# Patient Record
Sex: Female | Born: 1965 | Race: White | Hispanic: No | Marital: Married | Smoking: Never smoker
Health system: Southern US, Community
[De-identification: ages and names within clinical notes are randomized; demographics above are authoritative.]

## PROBLEM LIST (undated history)

## (undated) DIAGNOSIS — Z803 Family history of malignant neoplasm of breast: Secondary | ICD-10-CM

## (undated) DIAGNOSIS — N63 Unspecified lump in unspecified breast: Secondary | ICD-10-CM

## (undated) DIAGNOSIS — Z801 Family history of malignant neoplasm of trachea, bronchus and lung: Secondary | ICD-10-CM

## (undated) DIAGNOSIS — M1990 Unspecified osteoarthritis, unspecified site: Secondary | ICD-10-CM

## (undated) DIAGNOSIS — N6310 Unspecified lump in the right breast, unspecified quadrant: Secondary | ICD-10-CM

## (undated) DIAGNOSIS — Z808 Family history of malignant neoplasm of other organs or systems: Secondary | ICD-10-CM

## (undated) DIAGNOSIS — E039 Hypothyroidism, unspecified: Secondary | ICD-10-CM

## (undated) HISTORY — DX: Family history of malignant neoplasm of trachea, bronchus and lung: Z80.1

## (undated) HISTORY — PX: WISDOM TOOTH EXTRACTION: SHX21

## (undated) HISTORY — PX: CHOLECYSTECTOMY: SHX55

## (undated) HISTORY — DX: Family history of malignant neoplasm of other organs or systems: Z80.8

## (undated) HISTORY — DX: Family history of malignant neoplasm of breast: Z80.3

---
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
## 1999-03-14 ENCOUNTER — Encounter: Admission: RE | Admit: 1999-03-14 | Discharge: 1999-06-12 | Payer: Self-pay | Admitting: Obstetrics and Gynecology

## 1999-05-09 ENCOUNTER — Inpatient Hospital Stay (HOSPITAL_COMMUNITY): Admission: AD | Admit: 1999-05-09 | Discharge: 1999-05-09 | Payer: Self-pay | Admitting: *Deleted

## 1999-05-19 ENCOUNTER — Inpatient Hospital Stay (HOSPITAL_COMMUNITY): Admission: AD | Admit: 1999-05-19 | Discharge: 1999-05-22 | Payer: Self-pay | Admitting: Obstetrics and Gynecology

## 1999-12-17 ENCOUNTER — Other Ambulatory Visit: Admission: RE | Admit: 1999-12-17 | Discharge: 1999-12-17 | Payer: Self-pay | Admitting: Gynecology

## 2001-03-03 ENCOUNTER — Other Ambulatory Visit: Admission: RE | Admit: 2001-03-03 | Discharge: 2001-03-03 | Payer: Self-pay | Admitting: Gynecology

## 2001-12-30 ENCOUNTER — Inpatient Hospital Stay (HOSPITAL_COMMUNITY): Admission: AD | Admit: 2001-12-30 | Discharge: 2002-01-02 | Payer: Self-pay | Admitting: Obstetrics and Gynecology

## 2002-11-07 ENCOUNTER — Other Ambulatory Visit: Admission: RE | Admit: 2002-11-07 | Discharge: 2002-11-07 | Payer: Self-pay | Admitting: Obstetrics and Gynecology

## 2003-12-11 ENCOUNTER — Other Ambulatory Visit: Admission: RE | Admit: 2003-12-11 | Discharge: 2003-12-11 | Payer: Self-pay | Admitting: Obstetrics and Gynecology

## 2003-12-13 ENCOUNTER — Encounter: Admission: RE | Admit: 2003-12-13 | Discharge: 2003-12-13 | Payer: Self-pay | Admitting: Obstetrics and Gynecology

## 2004-12-29 ENCOUNTER — Other Ambulatory Visit: Admission: RE | Admit: 2004-12-29 | Discharge: 2004-12-29 | Payer: Self-pay | Admitting: Obstetrics and Gynecology

## 2009-05-16 ENCOUNTER — Encounter: Admission: RE | Admit: 2009-05-16 | Discharge: 2009-05-16 | Payer: Self-pay | Admitting: Obstetrics and Gynecology

## 2011-01-02 NOTE — Op Note (Signed)
[facility]  Patient:    Deanna Patel, Deanna Patel Visit Number: [id number] MRN: [id number]          Service Type: OBS Location: 910A 9102 01 Attending Physician:  Osborn Coho Dictated by:   Janeece Riggers Dareen Piano, M.D. Proc. Date: 12/30/01 Admit Date:  12/30/2001 Discharge Date: 01/02/2002                             Operative Report  PREOPERATIVE DIAGNOSES:       1. Intrauterine pregnancy at term.                               2. History of prior cesarean section.                               3. Patient refuses attempt at vaginal birth                                  after cesarean section.                               4. Hypothyroidism on Synthroid.  POSTOPERATIVE DIAGNOSES:      1. Intrauterine pregnancy at term.                               2. History of prior cesarean section.                               3. Patient refuses attempt at vaginal birth                                  after cesarean section.                               4. Hypothyroidism on Synthroid.  PROCEDURE:                    Repeat low transverse cesarean section.  SURGEON:                      Mark E. Dareen Piano, M.D.  ASSIST:                       Brook A. Edward Jolly, M.D.  ANTIBIOTICS:                  Ancef 1 g.  ANESTHESIA:                   Epidural.  DRAINS:                       Foley to bed side drainage.  ESTIMATED BLOOD LOSS:         900 cc.  SPECIMENS:                    None.  FINDINGS:  Normal appearing fallopian tubes, ovaries, and uterus.  Normal placenta.  Patient delivered one live viable white female infant.  COMPLICATIONS:                None.  PROCEDURE:                    Patient was taken to the operating room where an epidural anesthetic was placed without complications.  She was then placed in the dorsal supine position, prepped with Hibiclens, and draped in the usual fashion for this procedure.  Foley catheter was  placed.  A Pfannenstiel incision was made through the previous scar.  This was carried down to the fascia.  Fascia was entered in the midline, extended laterally.  Rectus muscles were then separated from the fascia with the Bovie.  Rectus muscles were divided in the midline with the knife and taken superiorly and inferiorly.  The bladder flap was taken down sharply.  A low transverse uterine incision was made in the midline and extended laterally with blunt dissection.  The amniotic fluid was noted to be clear.  The infant was delivered in the vertex presentation.  On delivery of the head the oropharynx and nostrils are bulb suctioned.  The remaining infant was then delivered. The cord was doubly clamped and cut and the infant handed to the waiting NICU team.  Cord blood was then obtained.  Placenta was then manually removed.  The uterine cavity was wiped with a wet lap and the uterine incision was closed in a single layer of 0 chromic in a running locking fashion.  The bladder flap was not closed.  The uterus was placed back in the abdominal cavity.  The abdominal gutters were wiped with a wet lap.  Hemostasis was checked and felt to be adequate.  The parietoperitoneum and rectus muscles reapproximated in the midline using 3-0 chromic in a running fashion.  The fascia was closed using 0 Monocryl suture in a running fashion.  Subcuticular tissue was made hemostatic with the Bovie.  Stainless steel clips were used to close the skin. Patient tolerated the procedure well.  She was taken to the recovery room in stable condition.  Instrument and lap counts were correct x2. Dictated by:   Janeece Riggers Dareen Piano, M.D. Attending Physician:  Osborn Coho DD:  12/30/01 TD:  01/02/02 Job: [id number]

## 2011-01-02 NOTE — Discharge Summary (Signed)
[facility]  Patient:    Deanna Patel, Deanna Patel Visit Number: [id number] MRN: [id number]          Service Type: OBS Location: 910A 9102 01 Attending Physician:  Osborn Coho Dictated by:   Gerrit Friends. Aldona Bar, M.D. Admit Date:  12/30/2001 Discharge Date: 01/02/2002                             Discharge Summary  DISCHARGE DIAGNOSES:          Term pregnancy, delivered 8 pound 2 ounce female infant with Apgars 8 and 9.  Blood type A+.  PROCEDURES:                   Repeat low transverse cesarean section.  SUMMARY:                      This 45 year old gravida 3, para 1 was admitted at term for repeat cesarean section.  Her hypothyroidism was well controlled during her pregnancy.  She was taken to the operating room on the day of admission for a low transverse cesarean section and was delivered of a viable female infant weighing 8 pounds 2 ounces with Apgars of 8 and 9.  Her postoperative/postpartum course was benign.  Her discharge hemoglobin was 11.3 with a white count of 14,100 and platelet count of 198,000.  She remained afebrile during her hospital course.  On the morning of May 19 she was ambulating well, tolerating a regular diet well, having normal bowel and bladder function, was afebrile.  Her breast-feeding was going well and she was deemed ready for discharge.  Accordingly, she was given all appropriate instructions at the time of discharge and understood all instructions well.  DISCHARGE MEDICATIONS:        1. Vitamins one q.d.                               2. Motrin 600 mg q.6h. p.r.n.                               3. Tylox one to two q.4-6h. p.r.n. for severe                                  pain.  FOLLOWUP:                     She will return to the office for follow-up in approximately four weeks time or as needed. Dictated by:   Gerrit Friends. Aldona Bar, M.D. Attending Physician:  Osborn Coho DD:  01/02/02 TD:  01/04/02 Job:  [id number]

## 2014-03-13 ENCOUNTER — Other Ambulatory Visit: Payer: Self-pay | Admitting: Family Medicine

## 2014-03-13 ENCOUNTER — Other Ambulatory Visit (HOSPITAL_COMMUNITY)
Admission: RE | Admit: 2014-03-13 | Discharge: 2014-03-13 | Disposition: A | Discharge status: Home / Self Care | Payer: BC Managed Care – PPO | Source: Ambulatory Visit | Attending: Family Medicine | Admitting: Family Medicine

## 2014-03-13 DIAGNOSIS — Z124 Encounter for screening for malignant neoplasm of cervix: Secondary | ICD-10-CM | POA: Insufficient documentation

## 2014-03-14 LAB — CYTOLOGY - PAP

## 2014-04-30 ENCOUNTER — Other Ambulatory Visit: Payer: Self-pay

## 2014-04-30 DIAGNOSIS — Z1231 Encounter for screening mammogram for malignant neoplasm of breast: Secondary | ICD-10-CM

## 2014-05-14 ENCOUNTER — Ambulatory Visit
Admission: RE | Admit: 2014-05-14 | Discharge: 2014-05-14 | Disposition: A | Discharge status: Home / Self Care | Payer: BC Managed Care – PPO | Source: Ambulatory Visit

## 2014-05-14 DIAGNOSIS — Z1231 Encounter for screening mammogram for malignant neoplasm of breast: Secondary | ICD-10-CM

## 2015-06-14 ENCOUNTER — Other Ambulatory Visit (HOSPITAL_COMMUNITY)
Admission: RE | Admit: 2015-06-14 | Discharge: 2015-06-14 | Disposition: A | Discharge status: Home / Self Care | Payer: BLUE CROSS/BLUE SHIELD | Source: Ambulatory Visit | Attending: Family Medicine | Admitting: Family Medicine

## 2015-06-14 ENCOUNTER — Other Ambulatory Visit: Payer: Self-pay | Admitting: Family Medicine

## 2015-06-14 DIAGNOSIS — Z124 Encounter for screening for malignant neoplasm of cervix: Secondary | ICD-10-CM | POA: Diagnosis present

## 2015-06-20 LAB — CYTOLOGY - PAP

## 2015-11-19 DIAGNOSIS — E038 Other specified hypothyroidism: Secondary | ICD-10-CM | POA: Diagnosis not present

## 2015-11-21 DIAGNOSIS — E063 Autoimmune thyroiditis: Secondary | ICD-10-CM | POA: Diagnosis not present

## 2015-11-21 DIAGNOSIS — E038 Other specified hypothyroidism: Secondary | ICD-10-CM | POA: Diagnosis not present

## 2016-06-11 DIAGNOSIS — L918 Other hypertrophic disorders of the skin: Secondary | ICD-10-CM | POA: Diagnosis not present

## 2016-06-11 DIAGNOSIS — D1801 Hemangioma of skin and subcutaneous tissue: Secondary | ICD-10-CM | POA: Diagnosis not present

## 2016-06-11 DIAGNOSIS — D2262 Melanocytic nevi of left upper limb, including shoulder: Secondary | ICD-10-CM | POA: Diagnosis not present

## 2016-06-11 DIAGNOSIS — D225 Melanocytic nevi of trunk: Secondary | ICD-10-CM | POA: Diagnosis not present

## 2016-06-11 DIAGNOSIS — D2272 Melanocytic nevi of left lower limb, including hip: Secondary | ICD-10-CM | POA: Diagnosis not present

## 2016-06-25 DIAGNOSIS — E559 Vitamin D deficiency, unspecified: Secondary | ICD-10-CM | POA: Diagnosis not present

## 2016-06-25 DIAGNOSIS — Z23 Encounter for immunization: Secondary | ICD-10-CM | POA: Diagnosis not present

## 2016-06-25 DIAGNOSIS — Z0000 Encounter for general adult medical examination without abnormal findings: Secondary | ICD-10-CM | POA: Diagnosis not present

## 2016-07-16 ENCOUNTER — Other Ambulatory Visit: Payer: Self-pay | Admitting: Family Medicine

## 2016-07-16 DIAGNOSIS — Z1231 Encounter for screening mammogram for malignant neoplasm of breast: Secondary | ICD-10-CM

## 2016-08-21 ENCOUNTER — Ambulatory Visit
Admission: RE | Admit: 2016-08-21 | Discharge: 2016-08-21 | Disposition: A | Discharge status: Home / Self Care | Payer: BLUE CROSS/BLUE SHIELD | Source: Ambulatory Visit | Attending: Family Medicine | Admitting: Family Medicine

## 2016-08-21 DIAGNOSIS — Z1231 Encounter for screening mammogram for malignant neoplasm of breast: Secondary | ICD-10-CM | POA: Diagnosis not present

## 2016-11-18 DIAGNOSIS — E063 Autoimmune thyroiditis: Secondary | ICD-10-CM | POA: Diagnosis not present

## 2016-11-18 DIAGNOSIS — E039 Hypothyroidism, unspecified: Secondary | ICD-10-CM | POA: Diagnosis not present

## 2016-11-19 DIAGNOSIS — E669 Obesity, unspecified: Secondary | ICD-10-CM | POA: Diagnosis not present

## 2016-11-19 DIAGNOSIS — Z6833 Body mass index (BMI) 33.0-33.9, adult: Secondary | ICD-10-CM | POA: Diagnosis not present

## 2016-11-19 DIAGNOSIS — E063 Autoimmune thyroiditis: Secondary | ICD-10-CM | POA: Diagnosis not present

## 2016-11-19 DIAGNOSIS — E038 Other specified hypothyroidism: Secondary | ICD-10-CM | POA: Diagnosis not present

## 2017-05-07 DIAGNOSIS — N63 Unspecified lump in unspecified breast: Secondary | ICD-10-CM

## 2017-05-07 HISTORY — DX: Unspecified lump in unspecified breast: N63.0

## 2017-06-29 ENCOUNTER — Other Ambulatory Visit: Payer: Self-pay | Admitting: Family Medicine

## 2017-06-29 DIAGNOSIS — Z0000 Encounter for general adult medical examination without abnormal findings: Secondary | ICD-10-CM | POA: Diagnosis not present

## 2017-06-29 DIAGNOSIS — Z23 Encounter for immunization: Secondary | ICD-10-CM | POA: Diagnosis not present

## 2017-06-29 DIAGNOSIS — N63 Unspecified lump in unspecified breast: Secondary | ICD-10-CM

## 2017-06-29 DIAGNOSIS — E559 Vitamin D deficiency, unspecified: Secondary | ICD-10-CM | POA: Diagnosis not present

## 2017-06-29 DIAGNOSIS — Z8632 Personal history of gestational diabetes: Secondary | ICD-10-CM | POA: Diagnosis not present

## 2017-06-29 DIAGNOSIS — E063 Autoimmune thyroiditis: Secondary | ICD-10-CM | POA: Diagnosis not present

## 2017-07-06 ENCOUNTER — Ambulatory Visit
Admission: RE | Admit: 2017-07-06 | Discharge: 2017-07-06 | Disposition: A | Discharge status: Home / Self Care | Payer: BLUE CROSS/BLUE SHIELD | Source: Ambulatory Visit | Attending: Family Medicine | Admitting: Family Medicine

## 2017-07-06 DIAGNOSIS — N6489 Other specified disorders of breast: Secondary | ICD-10-CM | POA: Diagnosis not present

## 2017-07-06 DIAGNOSIS — N63 Unspecified lump in unspecified breast: Secondary | ICD-10-CM

## 2017-07-06 DIAGNOSIS — R928 Other abnormal and inconclusive findings on diagnostic imaging of breast: Secondary | ICD-10-CM | POA: Diagnosis not present

## 2017-07-06 HISTORY — DX: Unspecified lump in unspecified breast: N63.0

## 2017-08-06 DIAGNOSIS — L918 Other hypertrophic disorders of the skin: Secondary | ICD-10-CM | POA: Diagnosis not present

## 2017-08-06 DIAGNOSIS — L438 Other lichen planus: Secondary | ICD-10-CM | POA: Diagnosis not present

## 2017-08-06 DIAGNOSIS — D2271 Melanocytic nevi of right lower limb, including hip: Secondary | ICD-10-CM | POA: Diagnosis not present

## 2017-08-06 DIAGNOSIS — L821 Other seborrheic keratosis: Secondary | ICD-10-CM | POA: Diagnosis not present

## 2017-08-06 DIAGNOSIS — L570 Actinic keratosis: Secondary | ICD-10-CM | POA: Diagnosis not present

## 2017-08-06 DIAGNOSIS — D224 Melanocytic nevi of scalp and neck: Secondary | ICD-10-CM | POA: Diagnosis not present

## 2017-11-15 DIAGNOSIS — E038 Other specified hypothyroidism: Secondary | ICD-10-CM | POA: Diagnosis not present

## 2017-11-15 DIAGNOSIS — E063 Autoimmune thyroiditis: Secondary | ICD-10-CM | POA: Diagnosis not present

## 2017-11-19 DIAGNOSIS — E038 Other specified hypothyroidism: Secondary | ICD-10-CM | POA: Diagnosis not present

## 2017-11-19 DIAGNOSIS — Z6832 Body mass index (BMI) 32.0-32.9, adult: Secondary | ICD-10-CM | POA: Diagnosis not present

## 2017-11-19 DIAGNOSIS — E063 Autoimmune thyroiditis: Secondary | ICD-10-CM | POA: Diagnosis not present

## 2017-11-19 DIAGNOSIS — E669 Obesity, unspecified: Secondary | ICD-10-CM | POA: Diagnosis not present

## 2018-03-03 DIAGNOSIS — S70361A Insect bite (nonvenomous), right thigh, initial encounter: Secondary | ICD-10-CM | POA: Diagnosis not present

## 2018-03-03 DIAGNOSIS — S70361S Insect bite (nonvenomous), right thigh, sequela: Secondary | ICD-10-CM | POA: Diagnosis not present

## 2018-09-12 ENCOUNTER — Other Ambulatory Visit: Payer: Self-pay | Admitting: Family Medicine

## 2018-09-12 DIAGNOSIS — Z1231 Encounter for screening mammogram for malignant neoplasm of breast: Secondary | ICD-10-CM

## 2018-09-28 DIAGNOSIS — D485 Neoplasm of uncertain behavior of skin: Secondary | ICD-10-CM | POA: Diagnosis not present

## 2018-09-28 DIAGNOSIS — D2262 Melanocytic nevi of left upper limb, including shoulder: Secondary | ICD-10-CM | POA: Diagnosis not present

## 2018-09-28 DIAGNOSIS — D2261 Melanocytic nevi of right upper limb, including shoulder: Secondary | ICD-10-CM | POA: Diagnosis not present

## 2018-09-28 DIAGNOSIS — D2371 Other benign neoplasm of skin of right lower limb, including hip: Secondary | ICD-10-CM | POA: Diagnosis not present

## 2018-09-28 DIAGNOSIS — L820 Inflamed seborrheic keratosis: Secondary | ICD-10-CM | POA: Diagnosis not present

## 2018-09-28 DIAGNOSIS — D225 Melanocytic nevi of trunk: Secondary | ICD-10-CM | POA: Diagnosis not present

## 2018-10-06 ENCOUNTER — Encounter: Payer: Self-pay | Admitting: Radiology

## 2018-10-06 ENCOUNTER — Ambulatory Visit
Admission: RE | Admit: 2018-10-06 | Discharge: 2018-10-06 | Disposition: A | Discharge status: Home / Self Care | Payer: BLUE CROSS/BLUE SHIELD | Source: Ambulatory Visit | Attending: Family Medicine | Admitting: Family Medicine

## 2018-10-06 DIAGNOSIS — Z1231 Encounter for screening mammogram for malignant neoplasm of breast: Secondary | ICD-10-CM

## 2018-10-10 ENCOUNTER — Ambulatory Visit
Admission: RE | Admit: 2018-10-10 | Discharge: 2018-10-10 | Disposition: A | Discharge status: Home / Self Care | Payer: BLUE CROSS/BLUE SHIELD | Source: Ambulatory Visit | Attending: Family Medicine | Admitting: Family Medicine

## 2018-10-10 ENCOUNTER — Other Ambulatory Visit: Payer: Self-pay | Admitting: Family Medicine

## 2018-10-10 DIAGNOSIS — R928 Other abnormal and inconclusive findings on diagnostic imaging of breast: Secondary | ICD-10-CM

## 2018-10-10 DIAGNOSIS — N6313 Unspecified lump in the right breast, lower outer quadrant: Secondary | ICD-10-CM | POA: Diagnosis not present

## 2018-10-11 ENCOUNTER — Other Ambulatory Visit: Payer: Self-pay | Admitting: Family Medicine

## 2018-10-11 DIAGNOSIS — N63 Unspecified lump in unspecified breast: Secondary | ICD-10-CM

## 2018-10-21 DIAGNOSIS — R928 Other abnormal and inconclusive findings on diagnostic imaging of breast: Secondary | ICD-10-CM | POA: Diagnosis not present

## 2018-11-03 ENCOUNTER — Other Ambulatory Visit: Payer: Self-pay | Admitting: General Surgery

## 2018-11-03 DIAGNOSIS — Z803 Family history of malignant neoplasm of breast: Secondary | ICD-10-CM | POA: Diagnosis not present

## 2018-11-03 DIAGNOSIS — N6310 Unspecified lump in the right breast, unspecified quadrant: Secondary | ICD-10-CM

## 2018-11-03 DIAGNOSIS — E039 Hypothyroidism, unspecified: Secondary | ICD-10-CM | POA: Diagnosis not present

## 2018-11-11 DIAGNOSIS — E038 Other specified hypothyroidism: Secondary | ICD-10-CM | POA: Diagnosis not present

## 2018-11-11 DIAGNOSIS — E063 Autoimmune thyroiditis: Secondary | ICD-10-CM | POA: Diagnosis not present

## 2018-12-08 ENCOUNTER — Other Ambulatory Visit: Payer: Self-pay | Admitting: General Surgery

## 2018-12-08 DIAGNOSIS — N6310 Unspecified lump in the right breast, unspecified quadrant: Secondary | ICD-10-CM

## 2018-12-22 ENCOUNTER — Other Ambulatory Visit: Payer: Self-pay | Admitting: General Surgery

## 2019-01-05 DIAGNOSIS — E063 Autoimmune thyroiditis: Secondary | ICD-10-CM | POA: Diagnosis not present

## 2019-01-05 DIAGNOSIS — E669 Obesity, unspecified: Secondary | ICD-10-CM | POA: Diagnosis not present

## 2019-01-05 DIAGNOSIS — E038 Other specified hypothyroidism: Secondary | ICD-10-CM | POA: Diagnosis not present

## 2019-01-06 NOTE — Pre-Procedure Instructions (Signed)
SARIAH GUE  01/06/2019      [facility] - [place], [place] - [address] [place] [address] Phone: [phone] Fax: [phone]    Your procedure is scheduled on TUESDAY, JUNE 2..   Report to [facility], Main Entrance or Entrance "A" at 8:00 AM                    Your surgery or procedure is scheduled for 10:00 AM    Call this number if you have problems the morning of surgery: [phone]  This is the number for the Pre- Surgical Desk.                 For any other questions, please call [phone], Monday - Friday 8 AM - 4 PM.    Remember:  Do not eat  after midnight.  You may drink clear liquids until 7:00 AM.  Clear liquids allowed are: Water, Juice (non-citric and without pulp), Carbonated beverages, Clear Tea, Black Coffee only, Plain Jell-O only and Gatorade    Take these medicines the morning of surgery with A SIP OF WATER :  Synthyroid  1 Week prior to surgery STOP taking Aspirin, Aspirin Products (Goody Powder, Excedrin Migraine), Ibuprofen (Advil), Naproxen (Aleve), Vitamins and Herbal Products (ie Fish Oil).                        Special instructions:   [facility]- Preparing For Surgery  Before surgery, you can play an important role. Because skin is not sterile, your skin needs to be as free of germs as possible. You can reduce the number of germs on your skin by washing with CHG (chlorahexidine gluconate) Soap before surgery.  CHG is an antiseptic cleaner which kills germs and bonds with the skin to continue killing germs even after washing.    Oral Hygiene is also important to reduce your risk of infection.  Remember - BRUSH YOUR TEETH THE MORNING OF SURGERY WITH YOUR REGULAR TOOTHPASTE  Please do not use if you have an allergy to CHG or antibacterial soaps. If your skin becomes reddened/irritated stop using the CHG.  Do not shave (including legs and underarms) for at least 48 hours prior to first CHG  shower. It is OK to shave your face.  Please follow these instructions carefully.   1. Shower the NIGHT BEFORE SURGERY and the MORNING OF SURGERY with CHG.   2. If you chose to wash your hair, wash your hair first as usual with your normal shampoo.  3. After you shampoo, wash your face and private area with the soap you use at home, then rinse your hair and body thoroughly to remove the shampoo and soap.  4. Use CHG as you would any other liquid soap. You can apply CHG directly to the skin and wash gently with a scrungie or a clean washcloth.   5. Apply the CHG Soap to your body ONLY FROM THE NECK DOWN.  Do not use on open wounds or open sores. Avoid contact with your eyes, ears, mouth and genitals (private parts).   6. Wash thoroughly, paying special attention to the area where your surgery will be performed.  7. Thoroughly rinse your body with warm water from the neck down.  8. DO NOT shower/wash with your normal soap after using and rinsing off the CHG Soap.  9. Pat yourself dry with a CLEAN  TOWEL.  10. Wear CLEAN PAJAMAS to bed the night before surgery, wear comfortable clothes the morning of surgery  11. Place CLEAN SHEETS on your bed the night of your first shower and DO NOT SLEEP WITH PETS.  Day of Surgery: Shower as Instructed Above              Do not wear lotions, powders, or perfumes, or deodorant. Please wear clean clothes to the hospital/surgery center.   Remember to brush your teeth WITH YOUR REGULAR TOOTHPASTE.             Do not wear jewelry, make-up or nail polish.  Do not shave 48 hours prior to surgery.    Do not bring valuables to the hospital.  [facility] any belongings or valuables.  Contacts, dentures or bridgework may not be worn into surgery.  Leave your suitcase in the car.  After surgery it may be brought to your room.  For patients admitted to the hospital, discharge time will be determined by your treatment team.  Patients  discharged the day of surgery will not be allowed to drive home.   Please read over the following fact sheets that you were given.

## 2019-01-10 ENCOUNTER — Encounter (HOSPITAL_COMMUNITY): Payer: Self-pay

## 2019-01-10 ENCOUNTER — Encounter (HOSPITAL_COMMUNITY)
Admission: RE | Admit: 2019-01-10 | Discharge: 2019-01-10 | Disposition: A | Discharge status: Home / Self Care | Payer: BLUE CROSS/BLUE SHIELD | Source: Ambulatory Visit | Attending: General Surgery | Admitting: General Surgery

## 2019-01-10 ENCOUNTER — Other Ambulatory Visit: Payer: Self-pay

## 2019-01-10 DIAGNOSIS — Z01812 Encounter for preprocedural laboratory examination: Secondary | ICD-10-CM | POA: Diagnosis not present

## 2019-01-10 HISTORY — DX: Unspecified osteoarthritis, unspecified site: M19.90

## 2019-01-10 HISTORY — DX: Hypothyroidism, unspecified: E03.9

## 2019-01-10 LAB — CBC
HCT: 42.4 % (ref 36.0–46.0)
Hemoglobin: 13.8 g/dL (ref 12.0–15.0)
MCH: 29.6 pg (ref 26.0–34.0)
MCHC: 32.5 g/dL (ref 30.0–36.0)
MCV: 91 fL (ref 80.0–100.0)
Platelets: 281 10*3/uL (ref 150–400)
RBC: 4.66 MIL/uL (ref 3.87–5.11)
RDW: 12.8 % (ref 11.5–15.5)
WBC: 6.6 10*3/uL (ref 4.0–10.5)
nRBC: 0 % (ref 0.0–0.2)

## 2019-01-10 LAB — BASIC METABOLIC PANEL
Anion gap: 8 (ref 5–15)
BUN: 12 mg/dL (ref 6–20)
CO2: 22 mmol/L (ref 22–32)
Calcium: 9.4 mg/dL (ref 8.9–10.3)
Chloride: 107 mmol/L (ref 98–111)
Creatinine, Ser: 0.76 mg/dL (ref 0.44–1.00)
GFR calc Af Amer: >60 mL/min (ref >60)
GFR calc non Af Amer: >60 mL/min (ref >60)
Glucose, Bld: 105 mg/dL — ABNORMAL HIGH (ref 70–99)
Potassium: 4.1 mmol/L (ref 3.5–5.1)
Sodium: 137 mmol/L (ref 135–145)

## 2019-01-10 NOTE — Progress Notes (Addendum)
PCP - [name], MD Cardiologist - pt denies  Chest x-ray - pt denies EKG - pt denies  Stress Test - pt denies ever ECHO - pt denies ever  Cardiac Cath - pt denies ever  Sleep Study - NO CPAP - n/a  Fasting Blood Sugar - n/a Checks Blood Sugar _____ times a day -n/a  Blood Thinner Instructions: n/a Aspirin Instructions: n/a-   Anesthesia review: NO  Patient denies shortness of breath, fever, cough and chest pain at PAT appointment  Patient verbalized understanding of instructions that were given to them at the PAT appointment. Patient was also instructed that they will need to review over the PAT instructions again at home before surgery.   Coronavirus Screening  Have you experienced the following symptoms:  Cough yes/no: No Fever (>100.66F)  yes/no: No Runny nose yes/no: No Sore throat yes/no: No Difficulty breathing/shortness of breath  yes/no: No  Have you or a family member traveled in the last 14 days and where? yes/no: No   If the patient indicates "YES" to the above questions, their PAT will be rescheduled to limit the exposure to others and, the surgeon will be notified. THE PATIENT WILL NEED TO BE ASYMPTOMATIC FOR 14 DAYS.   If the patient is not experiencing any of these symptoms, the PAT nurse will instruct them to NOT bring anyone with them to their appointment since they may have these symptoms or traveled as well.   Please remind your patients and families that hospital visitation restrictions are in effect and the importance of the restrictions.

## 2019-01-13 ENCOUNTER — Other Ambulatory Visit (HOSPITAL_COMMUNITY)
Admission: RE | Admit: 2019-01-13 | Discharge: 2019-01-13 | Disposition: A | Discharge status: Home / Self Care | Payer: BC Managed Care – PPO | Source: Ambulatory Visit | Attending: General Surgery | Admitting: General Surgery

## 2019-01-13 DIAGNOSIS — Z1159 Encounter for screening for other viral diseases: Secondary | ICD-10-CM | POA: Diagnosis not present

## 2019-01-14 LAB — NOVEL CORONAVIRUS, NAA (HOSP ORDER, SEND-OUT TO REF LAB; TAT 18-24 HRS): SARS-CoV-2, NAA: NOT DETECTED

## 2019-01-15 NOTE — H&P (Addendum)
Deanna Patel  [facility] Patient #: [id number] DOB: 03-Sep-1965 Married / Language: English / Race: White Female        History of Present Illness      This is a very pleasant 53 year old female accompanied by one of her female family members. She is referred by Dr. Juluis RainierElizabeth Patel because of a small mass seen on recent imaging studies. She has a strong family history for breast cancer and is concerned about a 6 month follow-up.     No prior history of breast surgery but has had cysts aspirated by Dr. [name] in the past. Recent imaging studies showed including mammogram and ultrasound show a probable benign small mass in the right breast at the 7 o'clock position. Inferior and slightly lateral. Rounded density with a benign-looking calcification at the periphery. They've read this as a possible collection of cysts versus fibrocystic change. Six-month follow-up was encouraged. The patient is concerned because of her family history and after lengthy discussion wants this area excised.      Past history reveals hypothyroidism, BMI 33, breast cyst aspiration Family history reveals a maternal aunt died of breast cancer. Paternal first cousin had breast cancer and is a survivor. Maternal first cousin had breast cancer, is a survivor, and apparently has some type of genetic mutation The patient has never had genetic testing Social history reveals she is married with 2 children and lives in [place]. 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 Denies alcohol or tobacco      We discussed risk for a while. I told her that referral for genetic counseling was appropriate when she was interested in that. I discussed the options for management of the right breast mass which would include six-month follow-up or excision. She only wants to consider excision and that is appropriate She'll be scheduled for right breast lumpectomy with radioactive  seed localization. I discussed the indications, details, techniques, and risks with her in detail. She is aware of the risk of bleeding, infection, cosmetic deformity, chronic pain, reoperation of cancer. She understands all these issues. All of her questions are answered. She agrees with this plan.   Past Surgical History  Gallbladder Surgery - Open   Diagnostic Studies History  Mammogram  within last year  Allergies  No Known Allergies  No Known Drug Allergies  Allergies Reconciled   Medication History  Synthroid (137MCG Tablet, Oral) Active. Vitamin D (Cholecalciferol) (10 MCG(400 UNIT) Tablet Chewable, Oral) Active. Medications Reconciled  Social History  Alcohol use  Occasional alcohol use. Caffeine use  Coffee, Tea. No drug use  Tobacco use  Former smoker.  Family History Arthritis  Mother. Breast Cancer  Family Members In General. Colon Polyps  Father. Diabetes Mellitus  Mother. Hypertension  Mother.  Pregnancy / Birth History  Age at menarche  13 years. Age of menopause  11646-52 Contraceptive History  Oral contraceptives. Gravida  3 Irregular periods  Length (months) of breastfeeding  3-6 Maternal age  51-35 Para  2  Other Problems  Lump In Breast  Thyroid Disease     Review of Systems  General Not Present- Appetite Loss, Chills, Fatigue, Fever, Night Sweats, Weight Gain and Weight Loss. Skin Not Present- Change in Wart/Mole, Dryness, Hives, Jaundice, New Lesions, Non-Healing Wounds, Rash and Ulcer. HEENT Present- Seasonal Allergies. Not Present- Earache, Hearing Loss, Hoarseness, Nose Bleed, Oral Ulcers, Ringing in the Ears, Sinus Pain, Sore Throat, Visual Disturbances, Wears glasses/contact lenses and Yellow Eyes. Respiratory  Not Present- Bloody sputum, Chronic Cough, Difficulty Breathing, Snoring and Wheezing. Breast Present- Breast Mass. Not Present- Breast Pain, Nipple Discharge and Skin Changes. Cardiovascular Not  Present- Chest Pain, Difficulty Breathing Lying Down, Leg Cramps, Palpitations, Rapid Heart Rate, Shortness of Breath and Swelling of Extremities. Gastrointestinal Not Present- Abdominal Pain, Bloating, Bloody Stool, Change in Bowel Habits, Chronic diarrhea, Constipation, Difficulty Swallowing, Excessive gas, Gets full quickly at meals, Hemorrhoids, Indigestion, Nausea, Rectal Pain and Vomiting. Female Genitourinary Not Present- Frequency, Nocturia, Painful Urination, Pelvic Pain and Urgency. Neurological Not Present- Decreased Memory, Fainting, Headaches, Numbness, Seizures, Tingling, Tremor, Trouble walking and Weakness. Psychiatric Not Present- Anxiety, Bipolar, Change in Sleep Pattern, Depression, Fearful and Frequent crying. Endocrine Present- Hot flashes. Not Present- Cold Intolerance, Excessive Hunger, Hair Changes, Heat Intolerance and New Diabetes. Hematology Not Present- Blood Thinners, Easy Bruising, Excessive bleeding, Gland problems, HIV and Persistent Infections.  Vitals  Weight: 215.8 lb Height: 67in Body Surface Area: 2.09 m Body Mass Index: 33.8 kg/m  Temp.: 98.66F(Oral)  Pulse: 98 (Regular)  P.OX: 99% (Room air) BP: 148/88 (Sitting, Left Arm, Standard)     Physical Exam  General Mental Status-Alert. General Appearance-Consistent with stated age. Hydration-Well hydrated. Voice-Normal.  Head and Neck Head-normocephalic, atraumatic with no lesions or palpable masses. Trachea-midline. Thyroid Gland Characteristics - normal size and consistency.  Eye Eyeball - Bilateral-Extraocular movements intact. Sclera/Conjunctiva - Bilateral-No scleral icterus.  Chest and Lung Exam Chest and lung exam reveals -quiet, even and easy respiratory effort with no use of accessory muscles and on auscultation, normal breath sounds, no adventitious sounds and normal vocal resonance. Inspection Chest Wall - Normal. Back - normal.  Breast Note: Breasts  are relatively large. No skin changes. Nipple and areolar complexes looked normal. No palpable mass or tenderness. No axillary adenopathy. No cervical adenopathy   Cardiovascular Cardiovascular examination reveals -normal heart sounds, regular rate and rhythm with no murmurs and normal pedal pulses bilaterally.  Abdomen Inspection Inspection of the abdomen reveals - No Hernias. Skin - Scar - no surgical scars. Palpation/Percussion Palpation and Percussion of the abdomen reveal - Soft, Non Tender, No Rebound tenderness, No Rigidity (guarding) and No hepatosplenomegaly. Auscultation Auscultation of the abdomen reveals - Bowel sounds normal.  Neurologic Neurologic evaluation reveals -alert and oriented x 3 with no impairment of recent or remote memory. Mental Status-Normal.  Musculoskeletal Normal Exam - Left-Upper Extremity Strength Normal and Lower Extremity Strength Normal. Normal Exam - Right-Upper Extremity Strength Normal and Lower Extremity Strength Normal.  Lymphatic Head & Neck  General Head & Neck Lymphatics: Bilateral - Description - Normal. Axillary  General Axillary Region: Bilateral - Description - Normal. Tenderness - Non Tender. Femoral & Inguinal  Generalized Femoral & Inguinal Lymphatics: Bilateral - Description - Normal. Tenderness - Non Tender.    Assessment & Plan  MASS OF RIGHT BREAST ON MAMMOGRAM (N63.10) .   Her recent mammograms and ultrasounds show a small mass in the right breast at the 7 o'clock position Less than 1 cm in size Maybe solid or maybe a small collection of cysts There is a benign-looking calcification in this area They have recommended six-month follow-up  You have expressed increased concern because of your strong family history of breast cancer, and that is appropriate  you qualify for genetic counseling and genetic testing and that something we can consider doing in the future  For now, you have the option of  six-month follow-up or proceeding with excision and you state that you definitely wanted this area excised  you'll be scheduled for right breast lumpectomy with radioactive seed localization I have discussed the indications, details, and techniques with you Because of the COVID-19 virus guidelines, we are going to wait 4-6 weeks before doing that. I consider that delay perfectly safe in your case  HYPOTHYROIDISM, ADULT (E03.9) FAMILY HISTORY OF BREAST CANCER IN FEMALE (Z80.3)   Saniah Schroeter M. Derrell Lolling, M.D., [facility] General and Minimally invasive Surgery Breast and Colorectal Surgery Office:   [phone] Pager:   [phone]

## 2019-01-16 ENCOUNTER — Ambulatory Visit
Admission: RE | Admit: 2019-01-16 | Discharge: 2019-01-16 | Disposition: A | Discharge status: Home / Self Care | Payer: BLUE CROSS/BLUE SHIELD | Source: Ambulatory Visit | Attending: General Surgery | Admitting: General Surgery

## 2019-01-16 ENCOUNTER — Other Ambulatory Visit: Payer: Self-pay

## 2019-01-16 DIAGNOSIS — N6313 Unspecified lump in the right breast, lower outer quadrant: Secondary | ICD-10-CM | POA: Diagnosis not present

## 2019-01-16 DIAGNOSIS — N6310 Unspecified lump in the right breast, unspecified quadrant: Secondary | ICD-10-CM

## 2019-01-16 HISTORY — PX: BREAST EXCISIONAL BIOPSY: SUR124

## 2019-01-17 ENCOUNTER — Other Ambulatory Visit: Payer: Self-pay

## 2019-01-17 ENCOUNTER — Ambulatory Visit (HOSPITAL_COMMUNITY): Payer: BC Managed Care – PPO | Admitting: Anesthesiology

## 2019-01-17 ENCOUNTER — Ambulatory Visit (HOSPITAL_COMMUNITY)
Admission: RE | Admit: 2019-01-17 | Discharge: 2019-01-17 | Disposition: A | Discharge status: Home / Self Care | Payer: BC Managed Care – PPO | Source: Ambulatory Visit | Attending: General Surgery | Admitting: General Surgery

## 2019-01-17 ENCOUNTER — Ambulatory Visit (HOSPITAL_COMMUNITY): Payer: BC Managed Care – PPO | Admitting: Physician Assistant

## 2019-01-17 ENCOUNTER — Encounter (HOSPITAL_COMMUNITY)
Admission: RE | Disposition: A | Discharge status: Home / Self Care | Payer: Self-pay | Source: Ambulatory Visit | Attending: General Surgery

## 2019-01-17 ENCOUNTER — Encounter (HOSPITAL_COMMUNITY): Payer: Self-pay | Admitting: *Deleted

## 2019-01-17 ENCOUNTER — Ambulatory Visit
Admission: RE | Admit: 2019-01-17 | Discharge: 2019-01-17 | Disposition: A | Discharge status: Home / Self Care | Payer: BLUE CROSS/BLUE SHIELD | Source: Ambulatory Visit | Attending: General Surgery | Admitting: General Surgery

## 2019-01-17 DIAGNOSIS — N6310 Unspecified lump in the right breast, unspecified quadrant: Secondary | ICD-10-CM

## 2019-01-17 DIAGNOSIS — R921 Mammographic calcification found on diagnostic imaging of breast: Secondary | ICD-10-CM | POA: Diagnosis not present

## 2019-01-17 DIAGNOSIS — Z803 Family history of malignant neoplasm of breast: Secondary | ICD-10-CM | POA: Diagnosis not present

## 2019-01-17 DIAGNOSIS — N6081 Other benign mammary dysplasias of right breast: Secondary | ICD-10-CM | POA: Diagnosis not present

## 2019-01-17 DIAGNOSIS — Z6833 Body mass index (BMI) 33.0-33.9, adult: Secondary | ICD-10-CM | POA: Diagnosis not present

## 2019-01-17 DIAGNOSIS — R928 Other abnormal and inconclusive findings on diagnostic imaging of breast: Secondary | ICD-10-CM | POA: Diagnosis not present

## 2019-01-17 DIAGNOSIS — N6011 Diffuse cystic mastopathy of right breast: Secondary | ICD-10-CM | POA: Diagnosis not present

## 2019-01-17 DIAGNOSIS — E669 Obesity, unspecified: Secondary | ICD-10-CM | POA: Diagnosis not present

## 2019-01-17 DIAGNOSIS — E039 Hypothyroidism, unspecified: Secondary | ICD-10-CM | POA: Insufficient documentation

## 2019-01-17 DIAGNOSIS — N6313 Unspecified lump in the right breast, lower outer quadrant: Secondary | ICD-10-CM | POA: Diagnosis not present

## 2019-01-17 HISTORY — DX: Unspecified lump in the right breast, unspecified quadrant: N63.10

## 2019-01-17 HISTORY — PX: BREAST LUMPECTOMY WITH RADIOACTIVE SEED LOCALIZATION: SHX6424

## 2019-01-17 SURGERY — BREAST LUMPECTOMY WITH RADIOACTIVE SEED LOCALIZATION
Anesthesia: General | Site: Breast | Laterality: Right

## 2019-01-17 MED ORDER — ONDANSETRON HCL 4 MG/2ML IJ SOLN
INTRAMUSCULAR | Status: DC | PRN
  Administered 2019-01-17: 4 mg via INTRAVENOUS

## 2019-01-17 MED ORDER — CHLORHEXIDINE GLUCONATE CLOTH 2 % EX PADS: 6.0000 | MEDICATED_PAD | Freq: Once | CUTANEOUS | Status: DC

## 2019-01-17 MED ORDER — PROMETHAZINE HCL 25 MG/ML IJ SOLN: 6.2500 mg | INTRAMUSCULAR | Status: DC | PRN

## 2019-01-17 MED ORDER — ONDANSETRON HCL 4 MG/2ML IJ SOLN
INTRAMUSCULAR | Status: AC
  Filled 2019-01-17: qty 2

## 2019-01-17 MED ORDER — PROPOFOL 10 MG/ML IV BOLUS
INTRAVENOUS | Status: AC
  Filled 2019-01-17: qty 20

## 2019-01-17 MED ORDER — GABAPENTIN 300 MG PO CAPS
300.0000 mg | ORAL_CAPSULE | ORAL | Status: AC
  Administered 2019-01-17: 300 mg via ORAL

## 2019-01-17 MED ORDER — LACTATED RINGERS IV SOLN
INTRAVENOUS | Status: DC
  Administered 2019-01-17: 1000 mL via INTRAVENOUS

## 2019-01-17 MED ORDER — MIDAZOLAM HCL 5 MG/5ML IJ SOLN
INTRAMUSCULAR | Status: DC | PRN
  Administered 2019-01-17: 2 mg via INTRAVENOUS

## 2019-01-17 MED ORDER — ACETAMINOPHEN 500 MG PO TABS
ORAL_TABLET | ORAL | Status: AC
  Administered 2019-01-17: 1000 mg via ORAL
  Filled 2019-01-17: qty 2

## 2019-01-17 MED ORDER — DEXAMETHASONE SODIUM PHOSPHATE 10 MG/ML IJ SOLN
INTRAMUSCULAR | Status: DC | PRN
  Administered 2019-01-17: 10 mg via INTRAVENOUS

## 2019-01-17 MED ORDER — FENTANYL CITRATE (PF) 100 MCG/2ML IJ SOLN
INTRAMUSCULAR | Status: DC | PRN
  Administered 2019-01-17 (×3): 50 ug via INTRAVENOUS

## 2019-01-17 MED ORDER — BUPIVACAINE-EPINEPHRINE 0.25% -1:200000 IJ SOLN
INTRAMUSCULAR | Status: DC | PRN
  Administered 2019-01-17: 8 mL

## 2019-01-17 MED ORDER — SODIUM CHLORIDE 0.9% FLUSH: 3.0000 mL | Freq: Two times a day (BID) | INTRAVENOUS | Status: DC

## 2019-01-17 MED ORDER — HYDROCODONE-ACETAMINOPHEN 5-325 MG PO TABS: 1.0000 | ORAL_TABLET | Freq: Four times a day (QID) | ORAL | 0 refills | Status: DC | PRN

## 2019-01-17 MED ORDER — BUPIVACAINE-EPINEPHRINE (PF) 0.25% -1:200000 IJ SOLN
INTRAMUSCULAR | Status: AC
  Filled 2019-01-17: qty 30

## 2019-01-17 MED ORDER — LIDOCAINE HCL (CARDIAC) PF 100 MG/5ML IV SOSY
PREFILLED_SYRINGE | INTRAVENOUS | Status: DC | PRN
  Administered 2019-01-17: 60 mg via INTRAVENOUS

## 2019-01-17 MED ORDER — HYDROMORPHONE HCL 1 MG/ML IJ SOLN: 0.2500 mg | INTRAMUSCULAR | Status: DC | PRN

## 2019-01-17 MED ORDER — DEXAMETHASONE SODIUM PHOSPHATE 10 MG/ML IJ SOLN
INTRAMUSCULAR | Status: AC
  Filled 2019-01-17: qty 1

## 2019-01-17 MED ORDER — FENTANYL CITRATE (PF) 250 MCG/5ML IJ SOLN
INTRAMUSCULAR | Status: AC
  Filled 2019-01-17: qty 5

## 2019-01-17 MED ORDER — CELECOXIB 200 MG PO CAPS
200.0000 mg | ORAL_CAPSULE | ORAL | Status: AC
  Administered 2019-01-17: 08:00:00 200 mg via ORAL

## 2019-01-17 MED ORDER — CELECOXIB 200 MG PO CAPS
ORAL_CAPSULE | ORAL | Status: AC
  Administered 2019-01-17: 08:00:00 200 mg via ORAL
  Filled 2019-01-17: qty 1

## 2019-01-17 MED ORDER — MIDAZOLAM HCL 2 MG/2ML IJ SOLN
INTRAMUSCULAR | Status: AC
  Filled 2019-01-17: qty 2

## 2019-01-17 MED ORDER — 0.9 % SODIUM CHLORIDE (POUR BTL) OPTIME
TOPICAL | Status: DC | PRN
  Administered 2019-01-17: 11:00:00 1000 mL

## 2019-01-17 MED ORDER — PHENYLEPHRINE 40 MCG/ML (10ML) SYRINGE FOR IV PUSH (FOR BLOOD PRESSURE SUPPORT)
PREFILLED_SYRINGE | INTRAVENOUS | Status: AC
  Filled 2019-01-17: qty 10

## 2019-01-17 MED ORDER — GABAPENTIN 300 MG PO CAPS
ORAL_CAPSULE | ORAL | Status: AC
  Administered 2019-01-17: 08:00:00 300 mg via ORAL
  Filled 2019-01-17: qty 1

## 2019-01-17 MED ORDER — PHENYLEPHRINE 40 MCG/ML (10ML) SYRINGE FOR IV PUSH (FOR BLOOD PRESSURE SUPPORT)
PREFILLED_SYRINGE | INTRAVENOUS | Status: DC | PRN
  Administered 2019-01-17: 80 ug via INTRAVENOUS
  Administered 2019-01-17: 40 ug via INTRAVENOUS
  Administered 2019-01-17 (×2): 80 ug via INTRAVENOUS

## 2019-01-17 MED ORDER — OXYCODONE HCL 5 MG PO TABS: 5.0000 mg | ORAL_TABLET | Freq: Once | ORAL | Status: DC | PRN

## 2019-01-17 MED ORDER — PROPOFOL 10 MG/ML IV BOLUS
INTRAVENOUS | Status: DC | PRN
  Administered 2019-01-17: 100 mg via INTRAVENOUS
  Administered 2019-01-17: 200 mg via INTRAVENOUS

## 2019-01-17 MED ORDER — CEFAZOLIN SODIUM-DEXTROSE 2-4 GM/100ML-% IV SOLN
INTRAVENOUS | Status: AC
  Filled 2019-01-17: qty 100

## 2019-01-17 MED ORDER — OXYCODONE HCL 5 MG/5ML PO SOLN: 5.0000 mg | Freq: Once | ORAL | Status: DC | PRN

## 2019-01-17 MED ORDER — ACETAMINOPHEN 500 MG PO TABS
1000.0000 mg | ORAL_TABLET | ORAL | Status: AC
  Administered 2019-01-17: 1000 mg via ORAL

## 2019-01-17 MED ORDER — CEFAZOLIN SODIUM-DEXTROSE 2-4 GM/100ML-% IV SOLN
2.0000 g | INTRAVENOUS | Status: AC
  Administered 2019-01-17: 2 g via INTRAVENOUS

## 2019-01-17 MED ORDER — LIDOCAINE 2% (20 MG/ML) 5 ML SYRINGE
INTRAMUSCULAR | Status: AC
  Filled 2019-01-17: qty 5

## 2019-01-17 SURGICAL SUPPLY — 40 items
APPLIER CLIP 9.375 MED OPEN (MISCELLANEOUS) ×3
BINDER BREAST XLRG (GAUZE/BANDAGES/DRESSINGS) ×3 IMPLANT
CANISTER SUCT 3000ML PPV (MISCELLANEOUS) ×3 IMPLANT
CHLORAPREP W/TINT 26 (MISCELLANEOUS) ×3 IMPLANT
CLIP APPLIE 9.375 MED OPEN (MISCELLANEOUS) ×1 IMPLANT
COVER PROBE W GEL 5X96 (DRAPES) ×3 IMPLANT
COVER SURGICAL LIGHT HANDLE (MISCELLANEOUS) ×3 IMPLANT
DERMABOND ADVANCED (GAUZE/BANDAGES/DRESSINGS) ×2
DERMABOND ADVANCED .7 DNX12 (GAUZE/BANDAGES/DRESSINGS) ×1 IMPLANT
DEVICE DUBIN SPECIMEN MAMMOGRA (MISCELLANEOUS) ×3 IMPLANT
DRAPE CHEST BREAST 15X10 FENES (DRAPES) ×3 IMPLANT
DRSG PAD ABDOMINAL 8X10 ST (GAUZE/BANDAGES/DRESSINGS) ×3 IMPLANT
ELECT CAUTERY BLADE 6.4 (BLADE) ×3 IMPLANT
ELECT REM PT RETURN 9FT ADLT (ELECTROSURGICAL) ×3
ELECTRODE REM PT RTRN 9FT ADLT (ELECTROSURGICAL) ×1 IMPLANT
GAUZE SPONGE 4X4 12PLY STRL (GAUZE/BANDAGES/DRESSINGS) ×3 IMPLANT
GLOVE BIOGEL PI IND STRL 6.5 (GLOVE) ×1 IMPLANT
GLOVE BIOGEL PI IND STRL 7.5 (GLOVE) ×1 IMPLANT
GLOVE BIOGEL PI INDICATOR 6.5 (GLOVE) ×2
GLOVE BIOGEL PI INDICATOR 7.5 (GLOVE) ×2
GLOVE EUDERMIC 7 POWDERFREE (GLOVE) ×6 IMPLANT
GLOVE SURG SS PI 6.0 STRL IVOR (GLOVE) ×3 IMPLANT
GLOVE SURG SS PI 6.5 STRL IVOR (GLOVE) ×3 IMPLANT
GLOVE SURG SS PI 7.0 STRL IVOR (GLOVE) ×3 IMPLANT
GOWN STRL REUS W/ TWL LRG LVL3 (GOWN DISPOSABLE) ×1 IMPLANT
GOWN STRL REUS W/ TWL XL LVL3 (GOWN DISPOSABLE) ×1 IMPLANT
GOWN STRL REUS W/TWL LRG LVL3 (GOWN DISPOSABLE) ×2
GOWN STRL REUS W/TWL XL LVL3 (GOWN DISPOSABLE) ×2
KIT BASIN OR (CUSTOM PROCEDURE TRAY) ×3 IMPLANT
KIT MARKER MARGIN INK (KITS) ×3 IMPLANT
NEEDLE HYPO 25GX1X1/2 BEV (NEEDLE) ×3 IMPLANT
NS IRRIG 1000ML POUR BTL (IV SOLUTION) ×3 IMPLANT
PACK GENERAL/GYN (CUSTOM PROCEDURE TRAY) ×3 IMPLANT
PAD ABD 8X10 STRL (GAUZE/BANDAGES/DRESSINGS) ×3 IMPLANT
SPONGE LAP 4X18 RFD (DISPOSABLE) ×3 IMPLANT
SUT MNCRL AB 4-0 PS2 18 (SUTURE) ×3 IMPLANT
SUT SILK 2 0 SH (SUTURE) ×3 IMPLANT
SUT VIC AB 3-0 SH 18 (SUTURE) ×3 IMPLANT
SYR CONTROL 10ML LL (SYRINGE) ×3 IMPLANT
TOWEL GREEN STERILE FF (TOWEL DISPOSABLE) ×3 IMPLANT

## 2019-01-17 NOTE — Op Note (Signed)
Patient Name:           Deanna Patel   Date of Surgery:        01/17/2019  Pre op Diagnosis:      Mass of right breast on mammogram (N63.10)  Post op Diagnosis:    Same  Procedure:                 Right breast lumpectomy with radioactive seed localization  Surgeon:                     Edsel Petrin. Dalbert Batman, M.D., FACS  Assistant:                      OR staff  Operative Indications:        This is a very pleasant 53 year old female . She is referred by Dr. Leighton Ruff because of a small right breast mass seen on recent imaging studies. She has a strong family history for breast cancer and is concerned about a 6 month follow-up.     No prior history of breast surgery but has had cysts aspirated by Dr. Ouida Sills in the past. Recent imaging studies showed including mammogram and ultrasound show a probable benign small mass in the right breast at the 7 o'clock position. Inferior and slightly lateral. Rounded density with a benign-looking calcification at the periphery. They've read this as a possible collection of cysts versus fibrocystic change. Six-month follow-up was encouraged. The patient is concerned because of her family history and after lengthy discussion wants this area excised.      Past history reveals hypothyroidism, BMI 33, breast cyst aspiration Family history reveals a maternal aunt died of breast cancer. Paternal first cousin had breast cancer and is a survivor. Maternal first cousin had breast cancer, is a survivor, and apparently has some type of genetic mutation The patient has never had genetic testing      We discussed risk for a while. I told her that referral for genetic counseling was appropriate when she was interested in that. I discussed the options for management of the right breast mass which would include six-month follow-up or excision. She only wants to consider excision and that is appropriate She'll be scheduled for right breast lumpectomy with  radioactive seed localization.  Operative Findings:       Mammographically the radioactive seed was in the right breast lower outer quadrant, middle depth.  Specimen mammogram showed the radioactive seed.  There was no biopsy clip since she had never had an image guided biopsy.  The seed was in the relative center of the specimen.  There was not much mass-effect on the specimen mammogram  Procedure in Detail:          Following the induction of general LMA anesthesia the patient's right breast was prepped and draped in a sterile fashion.  Surgical timeout was performed.  Intravenous antibiotics were given.  0.5% Marcaine with epinephrine was used as a local infiltration anesthetic.  The neoprobe was used.  This led to a transverse curvilinear circumareolar incision in the lower outer quadrant about 2-3 cm peripheral to the areolar margin.  Lumpectomy was performed with electrocautery and neoprobe.  The specimen was removed and marked with silk sutures and a 6 color ink kit.  The specimen mammogram looked good as described above.  The specimen was sent to the lab where the seed was retrieved.  The wound was irrigated.  Hemostasis was excellent.  The lumpectomy cavity was closed with interrupted sutures of 3-0 Vicryl and the skin closed with a running subcuticular 4-0 Monocryl and Dermabond.  Breast binder was placed and the patient taken to PACU in stable condition.  EBL 10 cc.  Counts correct.  Complications none.    Addendum: I logged onto the PMP aware website and reviewed her prescription medication history    Tru Rana M. Dalbert Batman, M.D., FACS General and Minimally Invasive Surgery Breast and Colorectal Surgery  01/17/2019 10:55 AM

## 2019-01-17 NOTE — Discharge Instructions (Signed)
[facility] °Office Phone Number [phone] ° °BREAST BIOPSY/ PARTIAL MASTECTOMY: POST OP INSTRUCTIONS ° °Always review your discharge instruction sheet given to you by the facility where your surgery was performed. ° °IF YOU HAVE DISABILITY OR FAMILY LEAVE FORMS, YOU MUST BRING THEM TO THE OFFICE FOR PROCESSING.  DO NOT GIVE THEM TO YOUR DOCTOR. ° °1. A prescription for pain medication may be given to you upon discharge.  Take your pain medication as prescribed, if needed.  If narcotic pain medicine is not needed, then you may take acetaminophen (Tylenol) or ibuprofen (Advil) as needed. °2. Take your usually prescribed medications unless otherwise directed °3. If you need a refill on your pain medication, please contact your pharmacy.  They will contact our office to request authorization.  Prescriptions will not be filled after 5pm or on week-ends. °4. You should eat very light the first 24 hours after surgery, such as soup, crackers, pudding, etc.  Resume your normal diet the day after surgery. °5. Most patients will experience some swelling and bruising in the breast.  Ice packs and a good support bra will help.  Swelling and bruising can take several days to resolve.  °6. It is common to experience some constipation if taking pain medication after surgery.  Increasing fluid intake and taking a stool softener will usually help or prevent this problem from occurring.  A mild laxative (Milk of Magnesia or Miralax) should be taken according to package directions if there are no bowel movements after 48 hours. °7. Unless discharge instructions indicate otherwise, you may remove your bandages 24-48 hours after surgery, and you may shower at that time.  You may have steri-strips (small skin tapes) in place directly over the incision.  These strips should be left on the skin for 7-10 days.  If your surgeon used skin glue on the incision, you may shower in 24 hours.  The glue will flake off over the  next 2-3 weeks.  Any sutures or staples will be removed at the office during your follow-up visit. °8. ACTIVITIES:  You may resume regular daily activities (gradually increasing) beginning the next day.  Wearing a good support bra or sports bra minimizes pain and swelling.  You may have sexual intercourse when it is comfortable. °a. You may drive when you no longer are taking prescription pain medication, you can comfortably wear a seatbelt, and you can safely maneuver your car and apply brakes. °b. RETURN TO WORK:  ______________________________________________________________________________________ °9. You should see your doctor in the office for a follow-up appointment approximately two weeks after your surgery.  Your doctor’s nurse will typically make your follow-up appointment when she calls you with your pathology report.  Expect your pathology report 2-3 business days after your surgery.  You may call to check if you do not hear from us after three days. °10. OTHER INSTRUCTIONS: _______________________________________________________________________________________________ _____________________________________________________________________________________________________________________________________ °_____________________________________________________________________________________________________________________________________ °_____________________________________________________________________________________________________________________________________ ° °WHEN TO CALL YOUR DOCTOR: °1. Fever over 101.0 °2. Nausea and/or vomiting. °3. Extreme swelling or bruising. °4. Continued bleeding from incision. °5. Increased pain, redness, or drainage from the incision. ° °The clinic staff is available to answer your questions during regular business hours.  Please don’t hesitate to call and ask to speak to one of the nurses for clinical concerns.  If you have a medical emergency, go to the nearest  emergency room or call 911.  A surgeon from [facility] is always on call at the hospital. ° °For further questions, please visit [email or web address]  ° ° ° ° ° ° ° ° ° ° °••••••••• ° ° °  Managing Your Pain After Surgery Without Opioids ° ° ° °Thank you for participating in our program to help patients manage their pain after surgery without opioids. This is part of our effort to provide you with the best care possible, without exposing you or your family to the risk that opioids pose. ° °What pain can I expect after surgery? °You can expect to have some pain after surgery. This is normal. The pain is typically worse the day after surgery, and quickly begins to get better. °Many studies have found that many patients are able to manage their pain after surgery with Over-the-Counter (OTC) medications such as Tylenol and Motrin. If you have a condition that does not allow you to take Tylenol or Motrin, notify your surgical team. ° °How will I manage my pain? °The best strategy for controlling your pain after surgery is around the clock pain control with Tylenol (acetaminophen) and Motrin (ibuprofen or Advil). Alternating these medications with each other allows you to maximize your pain control. In addition to Tylenol and Motrin, you can use heating pads or ice packs on your incisions to help reduce your pain. ° °How will I alternate your regular strength over-the-counter pain medication? °You will take a dose of pain medication every three hours. °; Start by taking 650 mg of Tylenol (2 pills of 325 mg) °; 3 hours later take 600 mg of Motrin (3 pills of 200 mg) °; 3 hours after taking the Motrin take 650 mg of Tylenol °; 3 hours after that take 600 mg of Motrin. ° ° °- 1 - ° °See example - if your first dose of Tylenol is at 12:00 PM ° ° °12:00 PM Tylenol 650 mg (2 pills of 325 mg)  °3:00 PM Motrin 600 mg (3 pills of 200 mg)  °6:00 PM Tylenol 650 mg (2 pills of 325 mg)  °9:00 PM Motrin 600 mg (3  pills of 200 mg)  °Continue alternating every 3 hours  ° °We recommend that you follow this schedule around-the-clock for at least 3 days after surgery, or until you feel that it is no longer needed. Use the table on the last page of this handout to keep track of the medications you are taking. °Important: °Do not take more than 3000mg of Tylenol or 3200mg of Motrin in a 24-hour period. °Do not take ibuprofen/Motrin if you have a history of bleeding stomach ulcers, severe kidney disease, &/or actively taking a blood thinner ° °What if I still have pain? °If you have pain that is not controlled with the over-the-counter pain medications (Tylenol and Motrin or Advil) you might have what we call “breakthrough” pain. You will receive a prescription for a small amount of an opioid pain medication such as Oxycodone, Tramadol, or Tylenol with Codeine. Use these opioid pills in the first 24 hours after surgery if you have breakthrough pain. Do not take more than 1 pill every 4-6 hours. ° °If you still have uncontrolled pain after using all opioid pills, don't hesitate to call our staff using the number provided. We will help make sure you are managing your pain in the best way possible, and if necessary, we can provide a prescription for additional pain medication. ° ° °Day 1   ° °Time  °Name of Medication Number of pills taken  °Amount of Acetaminophen  °Pain Level  ° °Comments  °AM PM       °AM PM       °AM PM       °  AM PM       °AM PM       °AM PM       °AM PM       °AM PM       °Total Daily amount of Acetaminophen °Do not take more than  3,000 mg per day    ° ° °Day 2   ° °Time  °Name of Medication Number of pills °taken  °Amount of Acetaminophen  °Pain Level  ° °Comments  °AM PM       °AM PM       °AM PM       °AM PM       °AM PM       °AM PM       °AM PM       °AM PM       °Total Daily amount of Acetaminophen °Do not take more than  3,000 mg per day    ° ° °Day 3   ° °Time  °Name of Medication Number of pills taken    °Amount of Acetaminophen  °Pain Level  ° °Comments  °AM PM       °AM PM       °AM PM       °AM PM       ° ° ° °AM PM       °AM PM       °AM PM       °AM PM       °Total Daily amount of Acetaminophen °Do not take more than  3,000 mg per day    ° ° °Day 4   ° °Time  °Name of Medication Number of pills taken  °Amount of Acetaminophen  °Pain Level  ° °Comments  °AM PM       °AM PM       °AM PM       °AM PM       °AM PM       °AM PM       °AM PM       °AM PM       °Total Daily amount of Acetaminophen °Do not take more than  3,000 mg per day    ° ° °Day 5   ° °Time  °Name of Medication Number °of pills taken  °Amount of Acetaminophen  °Pain Level  ° °Comments  °AM PM       °AM PM       °AM PM       °AM PM       °AM PM       °AM PM       °AM PM       °AM PM       °Total Daily amount of Acetaminophen °Do not take more than  3,000 mg per day    ° ° ° °Day 6   ° °Time  °Name of Medication Number of pills °taken  °Amount of Acetaminophen  °Pain Level  °Comments  °AM PM       °AM PM       °AM PM       °AM PM       °AM PM       °AM PM       °AM PM       °AM PM       °Total Daily amount of Acetaminophen °Do not take more than    3,000 mg per day    ° ° °Day 7   ° °Time  °Name of Medication Number of pills taken  °Amount of Acetaminophen  °Pain Level  ° °Comments  °AM PM       °AM PM       °AM PM       °AM PM       °AM PM       °AM PM       °AM PM       °AM PM       °Total Daily amount of Acetaminophen °Do not take more than  3,000 mg per day    ° ° ° ° °For additional information about how and where to safely dispose of unused opioid °medications - https://www.morepowerfulnc.org ° °Disclaimer: This document contains information and/or instructional materials adapted from [facility] for the typical patient with your condition. It does not replace medical advice from your health care provider because your experience may differ from that of the °typical patient. Talk to your health care provider if you have any questions about  this °document, your condition or your treatment plan. °Adapted from [facility] ° °

## 2019-01-17 NOTE — Anesthesia Postprocedure Evaluation (Signed)
Anesthesia Post Note  Patient: Deanna Patel  Procedure(s) Performed: RIGHT BREAST LUMPECTOMY WITH RADIOACTIVE SEED LOCALIZATION (Right Breast)     Patient location during evaluation: PACU Anesthesia Type: General Level of consciousness: awake and alert Pain management: pain level controlled Vital Signs Assessment: post-procedure vital signs reviewed and stable Respiratory status: spontaneous breathing, nonlabored ventilation, respiratory function stable and patient connected to nasal cannula oxygen Cardiovascular status: blood pressure returned to baseline and stable Postop Assessment: no apparent nausea or vomiting Anesthetic complications: no    Last Vitals:  Vitals:   01/17/19 1134 01/17/19 1141  BP: 108/74 115/68  Pulse: 60 61  Resp: 16 16  Temp: (!) 36.3 C   SpO2: 96% 96%    Last Pain:  Vitals:   01/17/19 0822  TempSrc: Oral  PainSc: 0-No pain                 Ryan P Ellender

## 2019-01-17 NOTE — Anesthesia Procedure Notes (Signed)
Procedure Name: LMA Insertion Date/Time: 01/17/2019 10:18 AM Performed by: Lovie Chol, CRNA Pre-anesthesia Checklist: Patient identified, Emergency Drugs available, Suction available and Patient being monitored Patient Re-evaluated:Patient Re-evaluated prior to induction Oxygen Delivery Method: Circle System Utilized Preoxygenation: Pre-oxygenation with 100% oxygen Induction Type: IV induction Ventilation: Mask ventilation without difficulty LMA: LMA inserted LMA Size: 4.0 Number of attempts: 1 Airway Equipment and Method: Bite block Placement Confirmation: positive ETCO2 and breath sounds checked- equal and bilateral Tube secured with: Tape Dental Injury: Teeth and Oropharynx as per pre-operative assessment

## 2019-01-17 NOTE — Transfer of Care (Signed)
Immediate Anesthesia Transfer of Care Note  Patient: Deanna Patel  Procedure(s) Performed: RIGHT BREAST LUMPECTOMY WITH RADIOACTIVE SEED LOCALIZATION (Right Breast)  Patient Location: PACU  Anesthesia Type:General  Level of Consciousness: oriented, drowsy and patient cooperative  Airway & Oxygen Therapy: Patient Spontanous Breathing and Patient connected to face mask oxygen  Post-op Assessment: Report given to RN and Post -op Vital signs reviewed and stable  Post vital signs: Reviewed  Last Vitals:  Vitals Value Taken Time  BP 108/66 01/17/2019 11:06 AM  Temp 36.5 C 01/17/2019 11:06 AM  Pulse 70 01/17/2019 11:11 AM  Resp 12 01/17/2019 11:11 AM  SpO2 95 % 01/17/2019 11:11 AM  Vitals shown include unvalidated device data.  Last Pain:  Vitals:   01/17/19 0822  TempSrc: Oral  PainSc: 0-No pain      Patients Stated Pain Goal: 2 (01/17/19 6004)  Complications: No apparent anesthesia complications

## 2019-01-17 NOTE — Anesthesia Preprocedure Evaluation (Addendum)
Anesthesia Evaluation  Patient identified by MRN, date of birth, ID band Patient awake    Reviewed: Allergy & Precautions, NPO status , Patient's Chart, lab work & pertinent test results  Airway Mallampati: II  TM Distance: >3 FB Neck ROM: Full    Dental no notable dental hx. (+) Teeth Intact, Dental Advisory Given   Pulmonary neg pulmonary ROS,    Pulmonary exam normal breath sounds clear to auscultation       Cardiovascular negative cardio ROS Normal cardiovascular exam Rhythm:Regular Rate:Normal     Neuro/Psych negative neurological ROS  negative psych ROS   GI/Hepatic negative GI ROS, Neg liver ROS,   Endo/Other  Hypothyroidism   Renal/GU negative Renal ROS     Musculoskeletal negative musculoskeletal ROS (+)   Abdominal (+) + obese,   Peds  Hematology negative hematology ROS (+)   Anesthesia Other Findings mass right breast abnormal mammogram  Reproductive/Obstetrics                           Anesthesia Physical Anesthesia Plan  ASA: II  Anesthesia Plan: General   Post-op Pain Management:    Induction:   PONV Risk Score and Plan: 3 and Ondansetron, Dexamethasone, Midazolam and Treatment may vary due to age or medical condition  Airway Management Planned: LMA  Additional Equipment:   Intra-op Plan:   Post-operative Plan: Extubation in OR  Informed Consent: I have reviewed the patients History and Physical, chart, labs and discussed the procedure including the risks, benefits and alternatives for the proposed anesthesia with the patient or authorized representative who has indicated his/her understanding and acceptance.     Dental advisory given  Plan Discussed with: CRNA  Anesthesia Plan Comments:        Anesthesia Quick Evaluation

## 2019-01-17 NOTE — Interval H&P Note (Signed)
History and Physical Interval Note:  01/17/2019 9:35 AM  Deanna Patel  has presented today for surgery, with the diagnosis of mass right breast, abnormal mammogram.  The various methods of treatment have been discussed with the patient and family. After consideration of risks, benefits and other options for treatment, the patient has consented to  Procedure(s): RIGHT BREAST LUMPECTOMY WITH RADIOACTIVE SEED LOCALIZATION (Right) as a surgical intervention.  The patient's history has been reviewed, patient examined, no change in status, stable for surgery.  I have reviewed the patient's chart and labs.  Questions were answered to the patient's satisfaction.     Ernestene Mention

## 2019-01-18 ENCOUNTER — Encounter (HOSPITAL_COMMUNITY): Payer: Self-pay | Admitting: General Surgery

## 2019-01-19 NOTE — Progress Notes (Signed)
Inform patient of Pathology report,. Breast pathology benign. Excellent news. I will discuss in detail at next OV.  Let me know.  Derrell Lolling

## 2019-02-08 ENCOUNTER — Encounter: Payer: Self-pay | Admitting: Hematology and Oncology

## 2019-02-08 ENCOUNTER — Telehealth: Payer: Self-pay | Admitting: Hematology and Oncology

## 2019-02-08 NOTE — Telephone Encounter (Signed)
Received a new referral from Dr. Dalbert Batman for the pt to be seen in the high risk breast clinic and urgent genetics. A new high risk appt has been scheduled for the pt to see Dr. Lindi Adie on 7/6 at 1pm and a webex visit for genetics on 7/9 at 8am. Letter mailed.

## 2019-02-18 NOTE — Progress Notes (Signed)
[facility] CONSULT NOTE  Patient Care Team: Leighton Ruff, MD as PCP - General (Family Medicine)  CHIEF COMPLAINTS/PURPOSE OF CONSULTATION:  Newly diagnosed high risk for breast cancer  HISTORY OF PRESENTING ILLNESS:  Deanna Patel 53 y.o. female is here because of recent diagnosis of high risk for breast cancer. A mammogram on 10/06/18 showed a right breast mass in the 7 o'clock position and 6 month follow-up was recommended. She elected to have the mass removed due to family history of breast cancer. She underwent a lumpectomy on 01/17/19 with Dr. Dalbert Batman. Pathology showed fibrocystic changes with usual ductal hyperplasia and calcifications. She has a family history of breast cancer in her maternal aunt, paternal first cousin, and maternal first cousin. She presents to the clinic today for initial evaluation.  I reviewed her records extensively and collaborated the history with the patient.   MEDICAL HISTORY:  Past Medical History:  Diagnosis Date  . Arthritis   . Breast mass 05/07/2017   right breast lump x 2 months   . Hypothyroidism   . Mass of right breast on mammogram 01/17/2019    SURGICAL HISTORY: Past Surgical History:  Procedure Laterality Date  . BREAST LUMPECTOMY WITH RADIOACTIVE SEED LOCALIZATION Right 01/17/2019   Procedure: RIGHT BREAST LUMPECTOMY WITH RADIOACTIVE SEED LOCALIZATION;  Surgeon: Fanny Skates, MD;  Location: [facility];  Service: General;  Laterality: Right;  . CESAREAN SECTION  2000, 2003   x 2  . CHOLECYSTECTOMY    . WISDOM TOOTH EXTRACTION      SOCIAL HISTORY: Social History   Socioeconomic History  . Marital status: Married    Spouse name: Not on file  . Number of children: 2  . Years of education: Not on file  . Highest education level: Not on file  Occupational History  . Not on file  Social Needs  . Financial resource strain: Not on file  . Food insecurity    Worry: Not on file    Inability: Not on file  .  Transportation needs    Medical: Not on file    Non-medical: Not on file  Tobacco Use  . Smoking status: Never Smoker  . Smokeless tobacco: Never Used  Substance and Sexual Activity  . Alcohol use: Not Currently    Comment: socially  . Drug use: Never  . Sexual activity: Not on file  Lifestyle  . Physical activity    Days per week: Not on file    Minutes per session: Not on file  . Stress: Not on file  Relationships  . Social Herbalist on phone: Not on file    Gets together: Not on file    Attends religious service: Not on file    Active member of club or organization: Not on file    Attends meetings of clubs or organizations: Not on file    Relationship status: Not on file  . Intimate partner violence    Fear of current or ex partner: Not on file    Emotionally abused: Not on file    Physically abused: Not on file    Forced sexual activity: Not on file  Other Topics Concern  . Not on file  Social History Narrative  . Not on file    FAMILY HISTORY: Family History  Problem Relation Age of Onset  . Breast cancer Maternal Aunt     ALLERGIES:  has No Known Allergies.  MEDICATIONS:  Current Outpatient Medications  Medication Sig Dispense  Refill  . Cholecalciferol (VITAMIN D) 50 MCG (2000 UT) tablet Take 4,000 Units by mouth daily.    Marland Kitchen. levothyroxine (SYNTHROID) 137 MCG tablet Take 137 mcg by mouth daily before breakfast. *NAME BRAND*     No current facility-administered medications for this visit.     REVIEW OF SYSTEMS:   Constitutional: Denies fevers, chills or abnormal night sweats Eyes: Denies blurriness of vision, double vision or watery eyes Ears, nose, mouth, throat, and face: Denies mucositis or sore throat Respiratory: Denies cough, dyspnea or wheezes Cardiovascular: Denies palpitation, chest discomfort or lower extremity swelling Gastrointestinal:  Denies nausea, heartburn or change in bowel habits Skin: Denies abnormal skin rashes  Lymphatics: Denies new lymphadenopathy or easy bruising Neurological:Denies numbness, tingling or new weaknesses Behavioral/Psych: Mood is stable, no new changes  Breast: Denies any palpable lumps or discharge All other systems were reviewed with the patient and are negative.  PHYSICAL EXAMINATION: ECOG PERFORMANCE STATUS: 1 - Symptomatic but completely ambulatory  Vitals:   02/20/19 1246  BP: 120/66  Pulse: 83  Resp: 20  Temp: 98.5 F (36.9 C)  SpO2: 97%   Filed Weights   02/20/19 1246  Weight: 221 lb 6.4 oz (100.4 kg)    Physical exam not done due to COVID-19 precautions  LABORATORY DATA:  I have reviewed the data as listed Lab Results  Component Value Date   WBC 6.6 01/10/2019   HGB 13.8 01/10/2019   HCT 42.4 01/10/2019   MCV 91.0 01/10/2019   PLT 281 01/10/2019   Lab Results  Component Value Date   NA 137 01/10/2019   K 4.1 01/10/2019   CL 107 01/10/2019   CO2 22 01/10/2019    ASSESSMENT AND PLAN:  At high risk for breast cancer Breast lumpectomy: [facility]  10-year risk of breast cancer: 16.8% (average woman's risk 2.7%) Lifetime risk of breast cancer: 46.6% (average risk 9.2%)  Surveillance plan: Based upon such high lifetime risk of breast cancer, I recommended annual breast MRIs and annual mammograms.  We will try to separate them by 6 months.  I sent an order for a 4161-month breast MRI.  Breast reduction: I discussed with patient risks and benefits of tamoxifen and raloxifene.  After listening to all the risks and benefits, she decided not to take antiestrogen therapy. Nonpharmacological breast reduction: I stressed to the patient the importance of diet exercise reducing red meat and increasing fruits and vegetables as well as small data for benefits to vitamin D and turmeric supplementation.  Patient has an appoint with genetic counseling this Thursday. Return to clinic in 1 year for follow-up.   All questions were answered. The patient knows to call the  clinic with any problems, questions or concerns.   Sabas SousVinay K Ijeoma Loor, MD 02/20/2019    I, Molly Dorshimer, am acting as scribe for Serena CroissantVinay Sheray Grist, MD.  I have reviewed the above documentation for accuracy and completeness, and I agree with the above.

## 2019-02-20 ENCOUNTER — Inpatient Hospital Stay: Payer: BC Managed Care – PPO | Attending: Hematology and Oncology | Admitting: Hematology and Oncology

## 2019-02-20 ENCOUNTER — Other Ambulatory Visit: Payer: Self-pay

## 2019-02-20 DIAGNOSIS — Z79899 Other long term (current) drug therapy: Secondary | ICD-10-CM | POA: Diagnosis not present

## 2019-02-20 DIAGNOSIS — N6310 Unspecified lump in the right breast, unspecified quadrant: Secondary | ICD-10-CM | POA: Diagnosis not present

## 2019-02-20 DIAGNOSIS — Z803 Family history of malignant neoplasm of breast: Secondary | ICD-10-CM | POA: Diagnosis not present

## 2019-02-20 DIAGNOSIS — M1990 Unspecified osteoarthritis, unspecified site: Secondary | ICD-10-CM | POA: Insufficient documentation

## 2019-02-20 DIAGNOSIS — Z1231 Encounter for screening mammogram for malignant neoplasm of breast: Secondary | ICD-10-CM

## 2019-02-20 DIAGNOSIS — E039 Hypothyroidism, unspecified: Secondary | ICD-10-CM | POA: Diagnosis not present

## 2019-02-20 DIAGNOSIS — Z9189 Other specified personal risk factors, not elsewhere classified: Secondary | ICD-10-CM | POA: Insufficient documentation

## 2019-02-20 NOTE — Assessment & Plan Note (Signed)
Breast lumpectomy: [facility]  10-year risk of breast cancer: 16.8% (average woman's risk 2.7%) Lifetime risk of breast cancer: 46.6% (average risk 9.2%)  Surveillance plan: Based upon such high lifetime risk of breast cancer, I recommended annual breast MRIs and annual mammograms.  We will try to separate them by 6 months.  I sent an order for a 46-month breast MRI.  Breast reduction: I discussed with patient risks and benefits of tamoxifen and raloxifene.  After listening to all the risks and benefits, she decided not to take antiestrogen therapy. Nonpharmacological breast reduction: I stressed to the patient the importance of diet exercise reducing red meat and increasing fruits and vegetables as well as small data for benefits to vitamin D and turmeric supplementation.  Return to clinic in 1 year for follow-up.

## 2019-02-23 ENCOUNTER — Inpatient Hospital Stay: Payer: BC Managed Care – PPO

## 2019-02-23 ENCOUNTER — Encounter: Payer: Self-pay | Admitting: Genetic Counselor

## 2019-02-23 ENCOUNTER — Inpatient Hospital Stay (HOSPITAL_BASED_OUTPATIENT_CLINIC_OR_DEPARTMENT_OTHER): Payer: BC Managed Care – PPO | Admitting: Genetic Counselor

## 2019-02-23 ENCOUNTER — Ambulatory Visit: Payer: BC Managed Care – PPO

## 2019-02-23 ENCOUNTER — Other Ambulatory Visit: Payer: Self-pay

## 2019-02-23 DIAGNOSIS — Z803 Family history of malignant neoplasm of breast: Secondary | ICD-10-CM

## 2019-02-23 DIAGNOSIS — Z808 Family history of malignant neoplasm of other organs or systems: Secondary | ICD-10-CM | POA: Insufficient documentation

## 2019-02-23 DIAGNOSIS — Z801 Family history of malignant neoplasm of trachea, bronchus and lung: Secondary | ICD-10-CM | POA: Insufficient documentation

## 2019-02-23 DIAGNOSIS — Z1379 Encounter for other screening for genetic and chromosomal anomalies: Secondary | ICD-10-CM

## 2019-02-23 NOTE — Progress Notes (Signed)
REFERRING PROVIDER: Fanny Skates, MD [address] [place],  [place] [address]  PRIMARY PROVIDER:  Leighton Ruff, MD  PRIMARY REASON FOR VISIT:  1. Family history of breast cancer   2. Family history of lung cancer   3. Family history of basal cell carcinoma      HISTORY OF PRESENT ILLNESS:   Deanna Patel, a 53 y.o. female, was seen for a [facility] cancer genetics consultation at the request of Dr. Dalbert Batman due to a family history of breast cancer.  Deanna Patel presents to clinic today to discuss the possibility of a hereditary predisposition to cancer, genetic testing, and to further clarify her future cancer risks, as well as potential cancer risks for family members.    Deanna Patel is a 53 y.o. female with no personal history of cancer.    CANCER HISTORY:  Oncology History   No history exists.    RISK FACTORS:  Menarche was at age 84.  First live birth at age 73.  Ovaries intact: not asked.  Hysterectomy: not asked.  Menopausal status: postmenopausal.  Mammogram within the last year: yes. Number of breast biopsies: 1.   Past Medical History:  Diagnosis Date  . Arthritis   . Breast mass 05/07/2017   right breast lump x 2 months   . Family history of basal cell carcinoma   . Family history of breast cancer   . Family history of lung cancer   . Hypothyroidism   . Mass of right breast on mammogram 01/17/2019    Past Surgical History:  Procedure Laterality Date  . BREAST LUMPECTOMY WITH RADIOACTIVE SEED LOCALIZATION Right 01/17/2019   Procedure: RIGHT BREAST LUMPECTOMY WITH RADIOACTIVE SEED LOCALIZATION;  Surgeon: Fanny Skates, MD;  Location: [facility];  Service: General;  Laterality: Right;  . CESAREAN SECTION  2000, 2003   x 2  . CHOLECYSTECTOMY    . WISDOM TOOTH EXTRACTION      Social History   Socioeconomic History  . Marital status: Married    Spouse name: Not on file  . Number of children: 2  . Years of education: Not on file  . Highest  education level: Not on file  Occupational History  . Not on file  Social Needs  . Financial resource strain: Not on file  . Food insecurity    Worry: Not on file    Inability: Not on file  . Transportation needs    Medical: Not on file    Non-medical: Not on file  Tobacco Use  . Smoking status: Never Smoker  . Smokeless tobacco: Never Used  Substance and Sexual Activity  . Alcohol use: Not Currently    Comment: socially  . Drug use: Never  . Sexual activity: Not on file  Lifestyle  . Physical activity    Days per week: Not on file    Minutes per session: Not on file  . Stress: Not on file  Relationships  . Social Herbalist on phone: Not on file    Gets together: Not on file    Attends religious service: Not on file    Active member of club or organization: Not on file    Attends meetings of clubs or organizations: Not on file    Relationship status: Not on file  Other Topics Concern  . Not on file  Social History Narrative  . Not on file     FAMILY HISTORY:  We obtained a detailed, 4-generation family history.  Significant diagnoses are listed below: Family History  Problem Relation Age of Onset  . Breast cancer Maternal Aunt 60  . Lung cancer Maternal Uncle 50       smoker  . Cancer Maternal Aunt        unknown  . Breast cancer Cousin 61       maternal first cousin  . Breast cancer Cousin 58       paternal first cousin, negative genetic testing  . Cancer Paternal Grandfather 66       unknown, possibly stomach or esophageal  . Basal cell carcinoma Father 50       x3   Deanna Patel has a maternal aunt who was diagnosed with breast cancer at age 43, and a maternal first cousin, Deanna Patel, who was diagnosed with breast cancer at age 32. She is unaware if this first cousin has had genetic testing. She has a maternal aunt who passed away from an unknown cancer in her early 65s, and a maternal uncle who passed away from lung cancer in his 21s.   Deanna Patel  father had three basal cell carcinomas removed in his 72s. Deanna Patel also has a paternal first cousin, Deanna Patel, who was diagnosed with breast cancer at age 65 and had negative genetic testing for hereditary cancer risks. Her paternal grandfather passed away from an unknown cancer, possibly stomach or esophageal cancer, at age 37.   Deanna Patel maternal ancestors are of [place] descent, and paternal ancestors are of [place] and [place] descent. There is no reported Ashkenazi Jewish ancestry. There is no known consanguinity.    GENETIC COUNSELING ASSESSMENT: Deanna Patel is a 53 y.o. female with a family history of breast which is somewhat suggestive of a hereditary cancer syndrome and predisposition to cancer. We, therefore, discussed and recommended the following at today's visit.   DISCUSSION: We discussed that 5 - 10% of breast cancer is hereditary, with most cases associated with BRCA1/2.  There are other genes that can be associated with hereditary breast cancer syndromes.  These include ATM, CHEK2, PALB2, etc.  We discussed that testing is beneficial for several reasons including knowing how to follow individuals for their cancer risks and to understand if other family members could be at risk for cancer and allow them to undergo genetic testing.   We reviewed the characteristics, features and inheritance patterns of hereditary cancer syndromes. We also discussed genetic testing, including the appropriate family members to test, the process of testing, insurance coverage and turn-around-time for results. We discussed the implications of a negative, positive and/or variant of uncertain significant result. We recommended Deanna Patel pursue genetic testing for the [facility] Common Hereditary Cancers gene panel.   The Common Hereditary Gene Panel offered by [facility] includes sequencing and/or deletion duplication testing of the following 48 genes: APC, ATM, AXIN2, BARD1, BMPR1A, BRCA1, BRCA2,  BRIP1, CDH1, CDK4, CDKN2A (p14ARF), CDKN2A (p16INK4a), CHEK2, CTNNA1, DICER1, EPCAM (Deletion/duplication testing only), GREM1 (promoter region deletion/duplication testing only), KIT, MEN1, MLH1, MSH2, MSH3, MSH6, MUTYH, NBN, NF1, NHTL1, PALB2, PDGFRA, PMS2, POLD1, POLE, PTEN, RAD50, RAD51C, RAD51D, RNF43, SDHB, SDHC, SDHD, SMAD4, SMARCA4. STK11, TP53, TSC1, TSC2, and VHL.  The following genes were evaluated for sequence changes only: SDHA and HOXB13 c.251G>A variant only.   Based on Deanna Patel's family history of cancer, she meets medical criteria for genetic testing. Despite that she meets criteria, she may still have an out of pocket cost. We discussed that if her out of pocket cost for testing is  over $100, the laboratory will call and confirm whether she wants to proceed with testing.  If the out of pocket cost of testing is less than $100 she will be billed by the genetic testing laboratory.   Based on the patient's family history, a statistical model Midwife) was used to estimate her risk of developing breast cancer. This estimates her lifetime risk of developing breast to be approximately 33.2%. This estimation does consider any genetic testing results.  The patient's lifetime breast cancer risk is a preliminary estimate based on available information using one of several models endorsed by the [place] (ACS). The ACS recommends consideration of breast MRI screening as an adjunct to mammography for patients at high risk (defined as 20% or greater lifetime risk).     Deanna Patel has been determined to be at high risk for breast cancer.  Therefore, we recommend that annual screening with mammography and breast MRI be performed.  We discussed that Deanna Patel should discuss her individual situation with her referring physician and determine a breast cancer screening plan with which they are both comfortable.    PLAN: After considering the risks, benefits, and limitations, Ms.  Patel provided informed consent to pursue genetic testing and the blood sample was sent to [facility] for analysis of the Common Hereditary Cancers gene panel. Results should be available within approximately three weeks' time, at which point they will be disclosed by telephone to Deanna Patel, as will any additional recommendations warranted by these results. Deanna Patel will receive a summary of her genetic counseling visit and a copy of her results once available. This information will also be available in Epic.   Lastly, we encouraged Deanna Patel to remain in contact with cancer genetics annually so that we can continuously update the family history and inform her of any changes in cancer genetics and testing that may be of benefit for this family.   Deanna Patel questions were answered to her satisfaction today. Our contact information was provided should additional questions or concerns arise. Thank you for the referral and allowing [place] to share in the care of your patient.   Clint Guy, MS Genetic Counselor [email or web address] Phone: [phone]   The patient was seen for a total of 30 minutes in face-to-face genetic counseling.  This patient was discussed with Drs. Magrinat, Lindi Adie and/or Burr Medico who agrees with the above.    _______________________________________________________________________ For Office Staff:  Number of people involved in session: 1 Was an Intern/ student involved with case: no

## 2019-03-06 ENCOUNTER — Ambulatory Visit: Payer: Self-pay | Admitting: Genetic Counselor

## 2019-03-06 ENCOUNTER — Telehealth: Payer: Self-pay | Admitting: Genetic Counselor

## 2019-03-06 DIAGNOSIS — Z1379 Encounter for other screening for genetic and chromosomal anomalies: Secondary | ICD-10-CM | POA: Insufficient documentation

## 2019-03-06 NOTE — Progress Notes (Signed)
HPI:  Deanna Patel was previously seen in the [facility] clinic due to a family history of breast, stomach, lung, and skin cancer and concerns regarding a hereditary predisposition to cancer. Please refer to our prior cancer genetics clinic note for more information regarding our discussion, assessment and recommendations, at the time. Deanna Patel recent genetic test results were disclosed to her, as were recommendations warranted by these results. These results and recommendations are discussed in more detail below.  CANCER HISTORY:  Oncology History   No history exists.    FAMILY HISTORY:  We obtained a detailed, 4-generation family history.  Significant diagnoses are listed below: Family History  Problem Relation Age of Onset   Breast cancer Maternal Aunt 60   Lung cancer Maternal Uncle 32       smoker   Cancer Maternal Aunt        unknown   Breast cancer Cousin 26       maternal first cousin   Breast cancer Cousin 53       paternal first cousin, negative genetic testing   Cancer Paternal Grandfather 39       unknown, possibly stomach or esophageal   Basal cell carcinoma Father 51       x3    Deanna Patel has a maternal aunt who was diagnosed with breast cancer at age 73, and a maternal first cousin, Deanna Patel, who was diagnosed with breast cancer at age 85. She is unaware if this first cousin has had genetic testing. She has a maternal aunt who passed away from an unknown cancer in her early 32s, and a maternal uncle who passed away from lung cancer in his 35s.   Deanna Patel father had three basal cell carcinomas removed in his 12s. Deanna Patel also has a paternal first cousin, Deanna Patel, who was diagnosed with breast cancer at age 47 and had negative genetic testing for hereditary cancer risks. Her paternal grandfather passed away from an unknown cancer, possibly stomach or esophageal cancer, at age 77.   Deanna Patel maternal ancestors are of [place]  descent, and paternal ancestors are of [place] and [place] descent. There is no reported Ashkenazi Jewish ancestry. There is no known consanguinity.    GENETIC TEST RESULTS: Genetic testing reported out on 03/06/2019 through the [facility] Common Hereditary Cancers panel found no pathogenic variants. The Common Hereditary Gene Panel offered by [facility] includes sequencing and/or deletion duplication testing of the following 48 genes: APC, ATM, AXIN2, BARD1, BMPR1A, BRCA1, BRCA2, BRIP1, CDH1, CDK4, CDKN2A (p14ARF), CDKN2A (p16INK4a), CHEK2, CTNNA1, DICER1, EPCAM (Deletion/duplication testing only), GREM1 (promoter region deletion/duplication testing only), KIT, MEN1, MLH1, MSH2, MSH3, MSH6, MUTYH, NBN, NF1, NHTL1, PALB2, PDGFRA, PMS2, POLD1, POLE, PTEN, RAD50, RAD51C, RAD51D, RNF43, SDHB, SDHC, SDHD, SMAD4, SMARCA4. STK11, TP53, TSC1, TSC2, and VHL.  The following genes were evaluated for sequence changes only: SDHA and HOXB13 c.251G>A variant only.  The test report has been scanned into EPIC and is located under the Molecular Pathology section of the Results Review tab.  A portion of the result report is included below for reference.     We discussed with Deanna Patel that because current genetic testing is not perfect, it is possible there may be a pathogenic variant in one of these genes that current testing cannot detect, but that chance is small.  We also discussed, that there could be another gene that has not yet been discovered, or that we have not yet tested, that is responsible for the  cancer diagnoses in the family. It is also possible there is a hereditary cause for the cancer in the family that Deanna Patel did not inherit and therefore was not identified in her testing.   ADDITIONAL GENETIC TESTING: We discussed with Deanna Patel that her genetic testing was fairly extensive.  If there are genes identified to increase cancer risk that can be analyzed in the future, we would be happy to discuss and  coordinate this testing at that time.    CANCER SCREENING RECOMMENDATIONS: Deanna Patel test result is considered negative (normal).  This means that we have not identified a hereditary cause for her family history of cancer at this time. Most cancers happen by chance and this negative test suggests that her cancer may fall into this category.    While reassuring, this does not definitively rule out a hereditary predisposition to cancer. It is still possible that there could be genetic variants that are undetectable by current technology. There could be genetic variants in genes that have not been tested or identified to increase cancer risk.  Therefore, it is recommended she continue to follow the cancer management and screening guidelines provided by her primary healthcare provider.   An individual's cancer risk and medical management are not determined by genetic test results alone. Overall cancer risk assessment incorporates additional factors, including personal medical history, family history, and any available genetic information that may result in a personalized plan for cancer prevention and surveillance  Based on Deanna Patel's family of cancer, as well as her genetic test results, a statistical model Midwife) was used to estimate her risk of developing breast cancer. This model estimates her lifetime risk of developing breast cancer to be approximately 31.9% (see below).  The patient's lifetime breast cancer risk is a preliminary estimate based on available information using one of several models endorsed by the [place] (ACS). The ACS recommends consideration of breast MRI screening as an adjunct to mammography for patients at high risk (defined as 20% or greater lifetime risk).    Deanna Patel has been determined to be at high risk for breast cancer.  Therefore, we recommend annual screening with mammography and breast MRI beginning now.  We discussed that Deanna Patel  should continue to discuss her individual situation with her referring physician and determine a breast cancer screening plan with which they are both comfortable.    RECOMMENDATIONS FOR FAMILY MEMBERS:  Individuals in this family might be at some increased risk of developing cancer, over the general population risk, simply due to the family history of cancer.  We recommended women in this family have a yearly mammogram beginning at age 64, or 75 years younger than the earliest onset of cancer, an annual clinical breast exam, and perform monthly breast self-exams. Women in this family should also have a gynecological exam as recommended by their primary provider. All family members should have a colonoscopy by age 53.  FOLLOW-UP: Lastly, we discussed with Deanna Patel that cancer genetics is a rapidly advancing field and it is possible that new genetic tests will be appropriate for her and/or her family members in the future. We encouraged her to remain in contact with cancer genetics on an annual basis so we can update her personal and family histories and let her know of advances in cancer genetics that may benefit this family.   Our contact number was provided. Deanna Patel questions were answered to her satisfaction, and she knows she is welcome to call  us at anytime with additional questions or concerns.   Clint Guy, MS Genetic Counselor [email or web address] Phone: [phone]

## 2019-03-06 NOTE — Telephone Encounter (Signed)
Revealed negative genetic testing.  Discussed that she is still at an increased risk for breast cancer based simply on her family history.  High risk breast screening is still appropriate.  Also advised Deanna Patel to contact the genetic testing laboratory regarding the cost of the test since she has not yet heard from them with an estimated cost.

## 2019-03-07 ENCOUNTER — Encounter: Payer: Self-pay | Admitting: Genetic Counselor

## 2019-04-11 ENCOUNTER — Ambulatory Visit
Admission: RE | Admit: 2019-04-11 | Discharge: 2019-04-11 | Disposition: A | Discharge status: Home / Self Care | Payer: BC Managed Care – PPO | Source: Ambulatory Visit | Attending: Family Medicine | Admitting: Family Medicine

## 2019-04-11 ENCOUNTER — Ambulatory Visit: Payer: BLUE CROSS/BLUE SHIELD

## 2019-04-11 ENCOUNTER — Other Ambulatory Visit: Payer: Self-pay

## 2019-04-11 DIAGNOSIS — N63 Unspecified lump in unspecified breast: Secondary | ICD-10-CM

## 2019-12-06 DIAGNOSIS — D2272 Melanocytic nevi of left lower limb, including hip: Secondary | ICD-10-CM | POA: Diagnosis not present

## 2019-12-06 DIAGNOSIS — D2271 Melanocytic nevi of right lower limb, including hip: Secondary | ICD-10-CM | POA: Diagnosis not present

## 2019-12-06 DIAGNOSIS — D2261 Melanocytic nevi of right upper limb, including shoulder: Secondary | ICD-10-CM | POA: Diagnosis not present

## 2019-12-06 DIAGNOSIS — D2262 Melanocytic nevi of left upper limb, including shoulder: Secondary | ICD-10-CM | POA: Diagnosis not present

## 2020-01-17 DIAGNOSIS — E063 Autoimmune thyroiditis: Secondary | ICD-10-CM | POA: Diagnosis not present

## 2020-01-17 DIAGNOSIS — E038 Other specified hypothyroidism: Secondary | ICD-10-CM | POA: Diagnosis not present

## 2020-01-19 DIAGNOSIS — E063 Autoimmune thyroiditis: Secondary | ICD-10-CM | POA: Diagnosis not present

## 2020-01-19 DIAGNOSIS — E038 Other specified hypothyroidism: Secondary | ICD-10-CM | POA: Diagnosis not present

## 2020-02-16 ENCOUNTER — Telehealth: Payer: Self-pay | Admitting: Hematology and Oncology

## 2020-02-16 NOTE — Telephone Encounter (Signed)
Called pt per 7/2 sch msg - no answer. Left message for patient to call back to reschedule.

## 2020-02-20 ENCOUNTER — Inpatient Hospital Stay: Payer: BC Managed Care – PPO | Attending: Hematology and Oncology | Admitting: Hematology and Oncology

## 2020-02-20 NOTE — Assessment & Plan Note (Deleted)
Breast lumpectomy: [facility]  10-year risk of breast cancer: 16.8% (average woman's risk 2.7%) Lifetime risk of breast cancer: 46.6% (average risk 9.2%)  Surveillance plan: Based upon such high lifetime risk of breast cancer, I recommended annual breast MRIs and annual mammograms.   Breast MRI has not been done.  Risk reduction: Patient was offered raloxifene or tamoxifen but she did not want to take that.  Return to clinic in 1 year for follow-up

## 2020-03-26 ENCOUNTER — Other Ambulatory Visit: Payer: Self-pay | Admitting: Family Medicine

## 2020-03-26 ENCOUNTER — Other Ambulatory Visit (HOSPITAL_COMMUNITY)
Admission: RE | Admit: 2020-03-26 | Discharge: 2020-03-26 | Disposition: A | Discharge status: Home / Self Care | Payer: BC Managed Care – PPO | Source: Ambulatory Visit | Attending: Family Medicine | Admitting: Family Medicine

## 2020-03-26 DIAGNOSIS — Z124 Encounter for screening for malignant neoplasm of cervix: Secondary | ICD-10-CM | POA: Insufficient documentation

## 2020-03-26 DIAGNOSIS — Z23 Encounter for immunization: Secondary | ICD-10-CM | POA: Diagnosis not present

## 2020-03-26 DIAGNOSIS — Z13220 Encounter for screening for lipoid disorders: Secondary | ICD-10-CM | POA: Diagnosis not present

## 2020-03-26 DIAGNOSIS — E559 Vitamin D deficiency, unspecified: Secondary | ICD-10-CM | POA: Diagnosis not present

## 2020-03-26 DIAGNOSIS — Z0000 Encounter for general adult medical examination without abnormal findings: Secondary | ICD-10-CM | POA: Diagnosis not present

## 2020-03-28 LAB — CYTOLOGY - PAP
Comment: NEGATIVE
Diagnosis: NEGATIVE
High risk HPV: NEGATIVE

## 2020-03-29 ENCOUNTER — Other Ambulatory Visit: Payer: Self-pay | Admitting: Family Medicine

## 2020-03-29 DIAGNOSIS — E2839 Other primary ovarian failure: Secondary | ICD-10-CM

## 2020-03-29 DIAGNOSIS — Z1231 Encounter for screening mammogram for malignant neoplasm of breast: Secondary | ICD-10-CM

## 2020-07-16 ENCOUNTER — Other Ambulatory Visit: Payer: Self-pay

## 2020-07-16 ENCOUNTER — Other Ambulatory Visit: Payer: BC Managed Care – PPO

## 2020-07-16 ENCOUNTER — Ambulatory Visit
Admission: RE | Admit: 2020-07-16 | Discharge: 2020-07-16 | Disposition: A | Discharge status: Home / Self Care | Payer: BC Managed Care – PPO | Source: Ambulatory Visit | Attending: Family Medicine | Admitting: Family Medicine

## 2020-07-16 DIAGNOSIS — Z1231 Encounter for screening mammogram for malignant neoplasm of breast: Secondary | ICD-10-CM

## 2020-08-13 DIAGNOSIS — Z23 Encounter for immunization: Secondary | ICD-10-CM | POA: Diagnosis not present

## 2020-09-19 ENCOUNTER — Ambulatory Visit
Admission: RE | Admit: 2020-09-19 | Discharge: 2020-09-19 | Disposition: A | Discharge status: Home / Self Care | Payer: BC Managed Care – PPO | Source: Ambulatory Visit | Attending: Family Medicine | Admitting: Family Medicine

## 2020-09-19 ENCOUNTER — Other Ambulatory Visit: Payer: Self-pay

## 2020-09-19 DIAGNOSIS — M8589 Other specified disorders of bone density and structure, multiple sites: Secondary | ICD-10-CM | POA: Diagnosis not present

## 2020-09-19 DIAGNOSIS — Z780 Asymptomatic menopausal state: Secondary | ICD-10-CM | POA: Diagnosis not present

## 2020-09-19 DIAGNOSIS — E2839 Other primary ovarian failure: Secondary | ICD-10-CM

## 2020-09-26 ENCOUNTER — Other Ambulatory Visit: Payer: BC Managed Care – PPO

## 2021-01-17 DIAGNOSIS — E038 Other specified hypothyroidism: Secondary | ICD-10-CM | POA: Diagnosis not present

## 2021-01-17 DIAGNOSIS — E063 Autoimmune thyroiditis: Secondary | ICD-10-CM | POA: Diagnosis not present

## 2021-01-24 DIAGNOSIS — E038 Other specified hypothyroidism: Secondary | ICD-10-CM | POA: Diagnosis not present

## 2021-01-24 DIAGNOSIS — E063 Autoimmune thyroiditis: Secondary | ICD-10-CM | POA: Diagnosis not present

## 2021-03-21 DIAGNOSIS — D2239 Melanocytic nevi of other parts of face: Secondary | ICD-10-CM | POA: Diagnosis not present

## 2021-03-21 DIAGNOSIS — D2371 Other benign neoplasm of skin of right lower limb, including hip: Secondary | ICD-10-CM | POA: Diagnosis not present

## 2021-03-21 DIAGNOSIS — D1801 Hemangioma of skin and subcutaneous tissue: Secondary | ICD-10-CM | POA: Diagnosis not present

## 2021-03-21 DIAGNOSIS — L821 Other seborrheic keratosis: Secondary | ICD-10-CM | POA: Diagnosis not present

## 2021-08-04 DIAGNOSIS — Z13220 Encounter for screening for lipoid disorders: Secondary | ICD-10-CM | POA: Diagnosis not present

## 2021-08-04 DIAGNOSIS — Z0000 Encounter for general adult medical examination without abnormal findings: Secondary | ICD-10-CM | POA: Diagnosis not present

## 2021-09-15 ENCOUNTER — Other Ambulatory Visit: Payer: Self-pay | Admitting: Family Medicine

## 2021-09-15 DIAGNOSIS — Z1231 Encounter for screening mammogram for malignant neoplasm of breast: Secondary | ICD-10-CM

## 2021-09-16 ENCOUNTER — Inpatient Hospital Stay: Payer: BC Managed Care – PPO

## 2021-09-16 ENCOUNTER — Inpatient Hospital Stay: Payer: BC Managed Care – PPO | Attending: Genetic Counselor | Admitting: Genetic Counselor

## 2021-09-16 ENCOUNTER — Other Ambulatory Visit: Payer: Self-pay

## 2021-09-16 DIAGNOSIS — Z808 Family history of malignant neoplasm of other organs or systems: Secondary | ICD-10-CM | POA: Diagnosis not present

## 2021-09-16 DIAGNOSIS — Z803 Family history of malignant neoplasm of breast: Secondary | ICD-10-CM

## 2021-09-16 DIAGNOSIS — Z8481 Family history of carrier of genetic disease: Secondary | ICD-10-CM | POA: Diagnosis not present

## 2021-09-16 LAB — GENETIC SCREENING ORDER

## 2021-09-17 ENCOUNTER — Encounter: Payer: Self-pay | Admitting: Genetic Counselor

## 2021-09-17 ENCOUNTER — Ambulatory Visit
Admission: RE | Admit: 2021-09-17 | Discharge: 2021-09-17 | Disposition: A | Discharge status: Home / Self Care | Payer: BC Managed Care – PPO | Source: Ambulatory Visit | Attending: Family Medicine | Admitting: Family Medicine

## 2021-09-17 DIAGNOSIS — Z8481 Family history of carrier of genetic disease: Secondary | ICD-10-CM | POA: Insufficient documentation

## 2021-09-17 DIAGNOSIS — Z808 Family history of malignant neoplasm of other organs or systems: Secondary | ICD-10-CM | POA: Insufficient documentation

## 2021-09-17 DIAGNOSIS — Z1231 Encounter for screening mammogram for malignant neoplasm of breast: Secondary | ICD-10-CM | POA: Diagnosis not present

## 2021-09-17 NOTE — Progress Notes (Signed)
REFERRING PROVIDER: Serena CroissantGudena, Vinay, MD [address] [place],  [place] [id number]  PRIMARY PROVIDER:  Juluis RainierBarnes, Elizabeth, MD (Inactive)  PRIMARY REASON FOR VISIT:  1. Family history of gene mutation   2. Family history of melanoma   3. Family history of breast cancer    HISTORY OF PRESENT ILLNESS:   Deanna Patel, a 56 y.o. female, was seen for a [facility] cancer genetics consultation due to a family history of a MITF gene mutation in her mother.  Deanna Patel presents to clinic today to discuss the possibility of a hereditary predisposition to cancer, to discuss genetic testing, and to further clarify her future cancer risks, as well as potential cancer risks for family members.   Deanna Patel is a 56 y.o. female with no personal history of cancer. Deanna Patel pursued [facility] Common Cancer Panel in 02/2019 (48 genes total). Her genetic testing was negative but she was not tested for the MITF gene.  CANCER HISTORY:  Oncology History   No history exists.    RISK FACTORS:  Menarche was at age 56.  First live birth at age 56.  Ovaries intact: yes.  Uterus intact: yes.  Menopausal status: postmenopausal, menopause at age 56  HRT use: 0 years. Colonoscopy: no Mammogram within the last year: yes, she does not have annual breast MRIs  Past Medical History:  Diagnosis Date   Arthritis    Breast mass 05/07/2017   right breast lump x 2 months    Family history of basal cell carcinoma    Family history of breast cancer    Family history of lung cancer    Hypothyroidism    Mass of right breast on mammogram 01/17/2019    Past Surgical History:  Procedure Laterality Date   BREAST EXCISIONAL BIOPSY Right 01/2019   Benign   BREAST LUMPECTOMY WITH RADIOACTIVE SEED LOCALIZATION Right 01/17/2019   Procedure: RIGHT BREAST LUMPECTOMY WITH RADIOACTIVE SEED LOCALIZATION;  Surgeon: Claud KelpIngram, Haywood, MD;  Location: [facility] OR;  Service: General;  Laterality: Right;   CESAREAN SECTION  2000,  2003   x 2   CHOLECYSTECTOMY     WISDOM TOOTH EXTRACTION      Social History   Socioeconomic History   Marital status: Married    Spouse name: Not on file   Number of children: 2   Years of education: Not on file   Highest education level: Not on file  Occupational History   Not on file  Tobacco Use   Smoking status: Never   Smokeless tobacco: Never  Vaping Use   Vaping Use: Never used  Substance and Sexual Activity   Alcohol use: Not Currently    Comment: socially   Drug use: Never   Sexual activity: Not on file  Other Topics Concern   Not on file  Social History Narrative   Not on file   Social Determinants of Health   Financial Resource Strain: Not on file  Food Insecurity: Not on file  Transportation Needs: Not on file  Physical Activity: Not on file  Stress: Not on file  Social Connections: Not on file     FAMILY HISTORY:  We obtained a detailed, 4-generation family history.  Significant diagnoses are listed below: Family History  Problem Relation Age of Onset   Breast cancer Mother 3079       genetic testing identified a MITF gene mutation   Melanoma Mother        dx. 50s   Basal cell carcinoma Father  50       x3   Melanoma Father    Liver cancer Paternal Grandfather 6   Breast cancer Maternal Aunt        dx. 40s   Cancer Maternal Aunt        unknown   Lung cancer Maternal Uncle 50       smoker   Breast cancer Cousin 25       maternal first cousin   Breast cancer Cousin 43       paternal first cousin, negative genetic testing   Kidney cancer Cousin        maternal first cousin     Ms. Simonet's mother was diagnosed with melanoma in her 20s and breast cancer at age 47. She had hereditary cancer genetic testing and was found to have a MITF gene mutation. Ms. Godfrey maternal aunt was diagnosed with breast cancer in her 23s, she died at 76. This aunt's son was diagnosed with kidney cancer. The daughter of a different maternal aunt was diagnosed  with breast cancer at age 44. Ms. Hommes father was diagnosed with melanoma. Her paternal cousin was diagnosed with breast cancer at age 69 and reportedly had negative genetic testing. Her paternal grandfather was diagnosed with liver cancer at age 56, he is deceased.      GENETIC COUNSELING ASSESSMENT: Ms. Artiga is a 56 y.o. female with a family history of a MITF gene mutation.  We discussed and recommended the following at today's visit.    DISCUSSION:  Ms. Katayama mother tested positive for a single pathogenic variant in the MITF gene. The MITF gene is associated with an increased risk for melanoma and possible renal cell carcinoma. Of note, her mother also had a variant of uncertain significance in the [facility] and GALNT12 genes. Family members are not recommended to have genetic testing for the variants of uncertain significance.     MITF Cancer Risks: 2-8 fold increased risk for melanoma Up to a 5-fold increased risk for renal cell carcinoma (RCC)   Clinical Information: Malignant melanoma is a neoplasm, or cancer, of melanocytes, the cells that produce pigment. Melanoma most often occurs in the skin, but may also affect the eyes, ears, gastrointestinal tract, and oral and genital membranes. Cutaneous melanoma is considered the most lethal skin cancer if not detected and treated during its early stages (PMID: 95188416). Approximately 5-10% of cases are familial (PMID: 60630160). The c.952G>A (p.Glu318Lys) variant in MITF, also known as E318K, is associated with an increased risk of melanoma (PMID: 10932355, 73220254, 27062376, 28315176, 16073710, 62694854, 62703500). The risks are not yet established; however, studies suggest the risk may be 2- to 8-fold higher than the general population risk (PMID: 93818299, 37169678). This variant has been associated with features including high nevi count (>200), fair skin, non-blue eye color, and early-onset melanoma (under age 57) (PMID: 93810175,  10258527, 78242353, 61443154). Additionally, there is evidence to suggest this variant may predispose to fast-growing melanomas (PMID: 00867619).   Studies showed an overrepresentation of renal cell carcinoma in individuals with this variant (PMID: 50932671, 24580998, 33825053, 97673419, 37902409 ); however, the studies were performed on relatively small patient populations and these findings have not been independently replicated. Therefore, the risk for renal cancer in individuals with the MITF E318K variant is currently unknown.  Management: While there remains lack of a clear consensus on dermatologic management and surveillance guidelines for individuals at increased risk for melanoma, heightened screening may result in early detection and removal of cutaneous  lesions at premalignant or early stages, associated with a more favorable prognosis (PMID: 18984210).  There are currently no published medical management guidelines for individuals at increased cancer risk due to a MITF mutation. We expect that medical management guidelines for the MITF gene may become available over time, thus we encouraged Ms. Rosezella Patel to contact us regularly for any updates. While there are no established screening or surveillance guidelines for individuals with the pathogenic E318K variant in MITF, the following recommendations have been suggested (PMID: 31281188, 67737366):   Skin Cancer Screening and Risk Reduction: Regular skin self-examinations Individuals should notify their physicians of any changes to moles such as increasing in size, darkening in color, or other change in appearance. Regular skin examinations by a dermatologist    Kidney Cancer Screening: Annual imaging for kidney tumors and/or regular clinical exams with a Urologist may be considered. Imaging of the kidneys may have unexpected findings and many times they are benign.    Family Members: Hereditary predisposition to cancer due to pathogenic  variants in the MITF gene has autosomal dominant inheritance. This means that an individual with a pathogenic variant has a 50% chance of passing the condition on to his/her offspring. Identification of a pathogenic variant allows for the recognition of at-risk relatives who can pursue testing for the familial variant.  We discussed that some people do not want to undergo genetic testing due to fear of genetic discrimination.  A federal law called the Genetic Information Non-Discrimination Act (GINA) of 2008 helps protect individuals against genetic discrimination based on their genetic test results.  It impacts both health insurance and employment.  With health insurance, it protects against increased premiums, being kicked off insurance or being forced to take a test in order to be insured.  For employment it protects against hiring, firing and promoting decisions based on genetic test results.  GINA does not apply to those in the [employer], those who work for companies with less than 15 employees, and new life insurance or long-term disability insurance policies.  Health status due to a cancer diagnosis is not protected under GINA.  PLAN: After considering the risks, benefits, and limitations, Ms. Cortopassi provided informed consent to pursue genetic testing and the blood sample was sent to [facility] for analysis of the familial MITF gene mutation. Results should be available within approximately 2-3 weeks' time, at which point they will be disclosed by telephone to Deanna Patel, as will any additional recommendations warranted by these results. Deanna Patel will receive a summary of her genetic counseling visit and a copy of her results once available. This information will also be available in Epic.   Deanna Patel questions were answered to her satisfaction today. Our contact information was provided should additional questions or concerns arise. Thank you for the referral and allowing [place] to share in  the care of your patient.   Lalla Brothers, MS, [facility] Genetic Counselor [email or web address] (P) [phone]  The patient was seen for a total of 30 minutes in face-to-face genetic counseling. The patient was seen alone.  Drs. Pamelia Hoit and/or Mosetta Putt were available to discuss this case as needed.  _______________________________________________________________________ For Office Staff:  Number of people involved in session: 1 Was an Intern/ student involved with case: no

## 2021-10-06 ENCOUNTER — Telehealth: Payer: Self-pay | Admitting: Genetic Counselor

## 2021-10-06 NOTE — Telephone Encounter (Signed)
I attempted to contact Ms. Tuitt to discuss her genetic testing results. I left a voicemail requesting she call me back at [phone].  Lucille Passy, MS, [facility] Genetic Counselor [email or web address] (P) [phone]

## 2021-10-07 ENCOUNTER — Encounter: Payer: Self-pay | Admitting: Genetic Counselor

## 2021-10-07 ENCOUNTER — Telehealth: Payer: Self-pay | Admitting: Genetic Counselor

## 2021-10-07 DIAGNOSIS — Z1509 Genetic susceptibility to other malignant neoplasm: Secondary | ICD-10-CM | POA: Insufficient documentation

## 2021-10-07 NOTE — Telephone Encounter (Signed)
I contacted Ms. Deanna Patel to discuss her genetic testing results. 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 Therefore, this is a positive genetic test result. Detailed clinic note to follow.  The test report has been scanned into EPIC and is located under the Molecular Pathology section of the Results Review tab.  A portion of the result report is included below for reference.   Deanna Brothers, MS, [facility] Genetic Counselor [email or web address] (P) [phone]

## 2021-10-09 ENCOUNTER — Ambulatory Visit: Payer: Self-pay | Admitting: Genetic Counselor

## 2021-10-09 DIAGNOSIS — Z1589 Genetic susceptibility to other disease: Secondary | ICD-10-CM

## 2021-10-09 NOTE — Progress Notes (Signed)
HPI:   Deanna Patel was previously seen in the [facility] clinic due to a family history of a MITF gene mutation. Please refer to our prior cancer genetics clinic note for more information regarding our discussion, assessment and recommendations, at the time. Deanna Patel recent genetic test results were disclosed to her, as were recommendations warranted by these results. These results and recommendations are discussed in more detail below.  CANCER HISTORY:  Oncology History   No history exists.    FAMILY HISTORY:  We obtained a detailed, 4-generation family history.  Significant diagnoses are listed below: Family History  Problem Relation Age of Onset   Breast cancer Mother 28       genetic testing identified a MITF gene mutation   Melanoma Mother        dx. 50s   Basal cell carcinoma Father 50       x3   Melanoma Father    Liver cancer Paternal Grandfather 79   Breast cancer Maternal Aunt        dx. 40s   Cancer Maternal Aunt        unknown   Lung cancer Maternal Uncle 50       smoker   Breast cancer Cousin 22       maternal first cousin   Breast cancer Cousin 44       paternal first cousin, negative genetic testing   Kidney cancer Cousin        maternal first cousin    We obtained a detailed, 4-generation family history.  Significant diagnoses are listed below:      Family History  Problem Relation Age of Onset   Breast cancer Mother 8        genetic testing identified a MITF gene mutation   Melanoma Mother          dx. 50s   Basal cell carcinoma Father 50        x3   Melanoma Father     Liver cancer Paternal Grandfather 29   Breast cancer Maternal Aunt          dx. 40s   Cancer Maternal Aunt          unknown   Lung cancer Maternal Uncle 50        smoker   Breast cancer Cousin 50        maternal first cousin   Breast cancer Cousin 32        paternal first cousin, negative genetic testing   Kidney cancer Cousin          maternal first  cousin       Deanna Patel's mother was diagnosed with melanoma in her 32s and breast cancer at age 53. She had hereditary cancer genetic testing and was found to have a MITF gene mutation. Deanna Patel maternal aunt was diagnosed with breast cancer in her 81s, she died at 82. This aunt's son was diagnosed with kidney cancer. The daughter of a different maternal aunt was diagnosed with breast cancer at age 41. Deanna Patel father was diagnosed with melanoma. Her paternal cousin was diagnosed with breast cancer at age 64 and reportedly had negative genetic testing. Her paternal grandfather was diagnosed with liver cancer at age 44, he is deceased.    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 Specifically, this variant is p.E318K. The MITF gene is associated with  an increased risk for melanoma and possible renal cell carcinoma.    The test report has been scanned into EPIC and is located under the Molecular Pathology section of the Results Review tab.  A portion of the result report is included below for reference. Genetic testing reported out on 10/01/2021.        MITF Cancer Risks: 2-8 fold increased risk for melanoma Up to a 5-fold increased risk for renal cell carcinoma (RCC)   Clinical Information: Malignant melanoma is a neoplasm, or cancer, of melanocytes, the cells that produce pigment. Melanoma most often occurs in the skin, but may also affect the eyes, ears, gastrointestinal tract, and oral and genital membranes. Cutaneous melanoma is considered the most lethal skin cancer if not detected and treated during its early stages (PMID: 03559741). Approximately 5-10% of cases are familial (PMID: 63845364). The c.952G>A (p.Glu318Lys) variant in MITF, also known as E318K, is associated with an increased risk of melanoma (PMID: 68032122, 48250037, 04888916, 94503888, 28003491, 79150569, 79480165). The  risks are not yet established; however, studies suggest the risk may be 2- to 8-fold higher than the general population risk (PMID: 53748270, 78675449). This variant has been associated with features including high nevi count (>200), fair skin, non-blue eye color, and early-onset melanoma (under age 82) (PMID: 20100712, 19758832, 54982641, 58309407). Additionally, there is evidence to suggest this variant may predispose to fast-growing melanomas (PMID: 68088110).   Studies showed an overrepresentation of renal cell carcinoma in individuals with this variant (PMID: 31594585, 92924462, 86381771, 16579038, 33383291 ); however, the studies were performed on relatively small patient populations and these findings have not been independently replicated. Therefore, the risk for renal cancer in individuals with the MITF E318K variant is currently unknown.  Management: While there remains lack of a clear consensus on dermatologic management and surveillance guidelines for individuals at increased risk for melanoma, heightened screening may result in early detection and removal of cutaneous lesions at premalignant or early stages, associated with a more favorable prognosis (PMID: 91660600).  There are currently no published medical management guidelines for individuals at increased cancer risk due to a MITF mutation. We expect that medical management guidelines for the MITF gene may become available over time, thus we encouraged Deanna Patel to contact us regularly for any updates. While there are no established screening or surveillance guidelines for individuals with the pathogenic E318K variant in MITF, the following recommendations have been suggested (PMID: 45997741, 42395320):   Skin Cancer Screening and Risk Reduction: Regular skin self-examinations Individuals should notify their physicians of any changes to moles such as increasing in size, darkening in color, or other change in appearance. Regular skin  examinations by a dermatologist  Follow sun-safety recommendations such as: Using UVA and UVB 30 SPF or higher sunscreen Avoiding sunburns Wearing protective clothing and sunglasses Avoid using tanning beds For more information about the prevention of melanoma visit [email or web address]  Kidney Cancer Screening: Deanna Patel was informed of the possible increased chance of renal cell carcinoma.  We discussed that annual imaging for kidney tumors and/or regular clinical exams with a Urologist may be considered. Imaging of the kidneys may have unexpected findings and many times they are benign. We recommend contacting [facility] at [phone] if she is interested in being followed by a Urologist.    Family Members: Hereditary predisposition to cancer due to pathogenic variants in the MITF gene has autosomal dominant inheritance. This means that an individual with a pathogenic variant has a 50% chance of passing the  condition on to his/her offspring. Identification of a pathogenic variant allows for the recognition of at-risk relatives who can pursue testing for the familial variant.   Family members may consider genetic testing for this familial pathogenic variant. As there are generally no childhood cancer risks associated with pathogenic variants in the MITF gene, individuals in the family are not recommended to have testing until they reach at least 54 years of age. They may contact our office at [phone] for more information or to schedule an appointment. Family members who live outside of the area are encouraged to find a genetic counselor in their area by visiting: [email or web address].  Our contact number was provided. Deanna Patel questions were answered to her satisfaction, and she knows she is welcome to call us at anytime with additional questions or concerns.   Lalla Brothers, MS, [facility] Genetic Counselor [email or web address] (P)  [phone]

## 2022-01-22 DIAGNOSIS — E038 Other specified hypothyroidism: Secondary | ICD-10-CM | POA: Diagnosis not present

## 2022-01-22 DIAGNOSIS — E063 Autoimmune thyroiditis: Secondary | ICD-10-CM | POA: Diagnosis not present

## 2022-02-07 IMAGING — MG MM DIGITAL SCREENING BILAT W/ TOMO AND CAD
8 series · 8 of 24 positions shown · non-contrast
Comparison: Previous exam(s).

CLINICAL DATA: Screening.

EXAM:
DIGITAL SCREENING BILATERAL MAMMOGRAM WITH TOMOSYNTHESIS AND CAD
TECHNIQUE: Bilateral screening digital craniocaudal and mediolateral oblique
mammograms were obtained. Bilateral screening digital breast
tomosynthesis was performed. The images were evaluated with
computer-aided detection.

[L CC synth-2D]
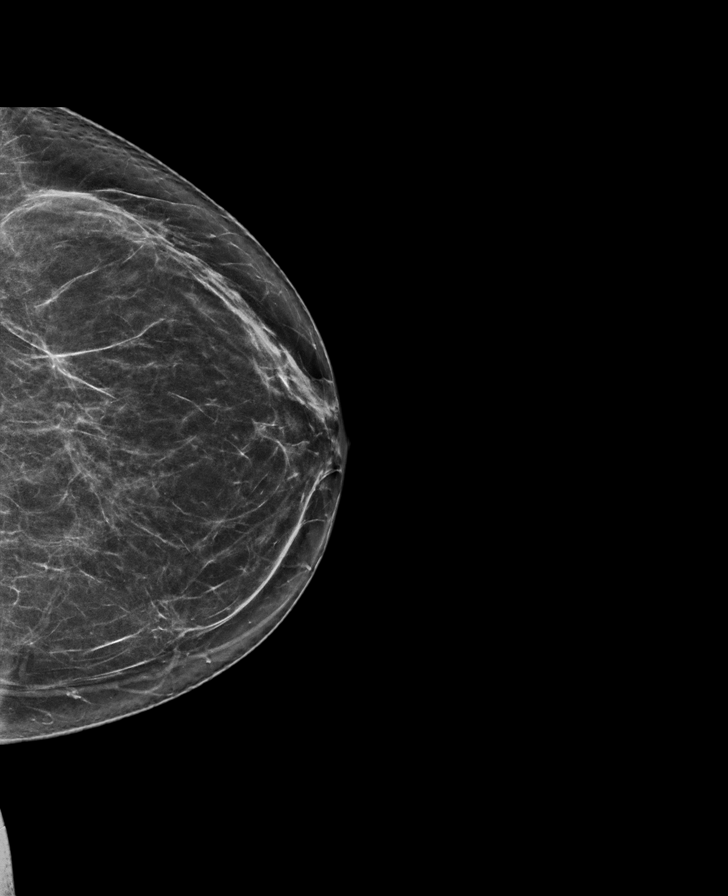

[L MLO synth-2D]
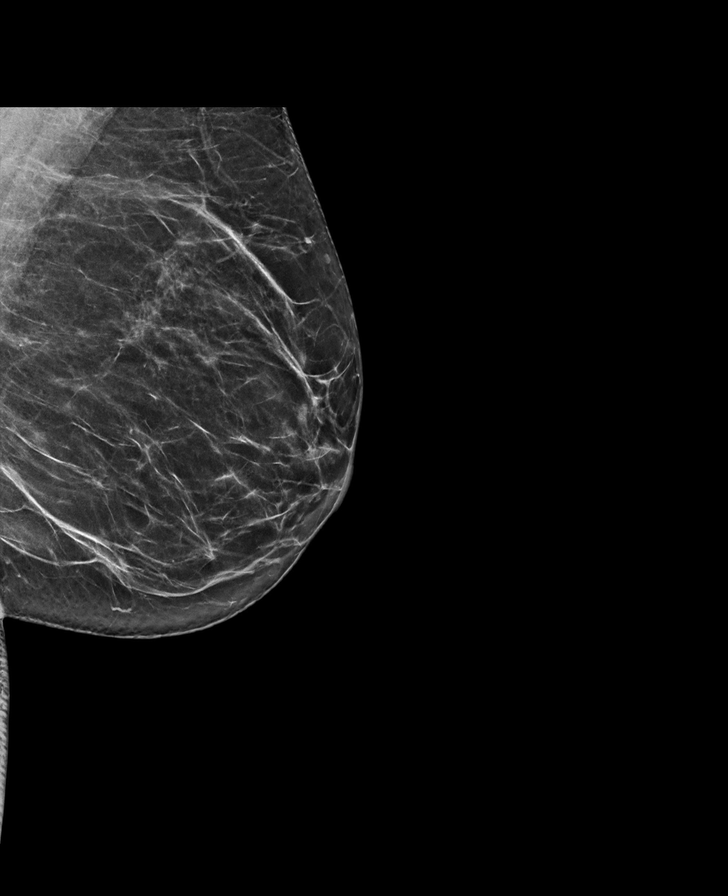

[R MLO synth-2D]
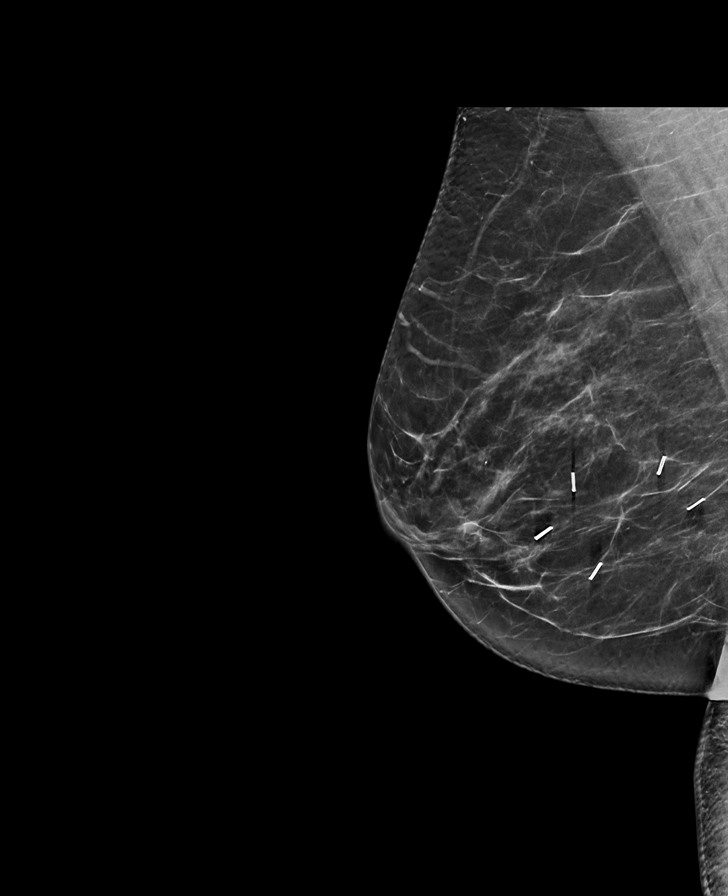

[R CC synth-2D]
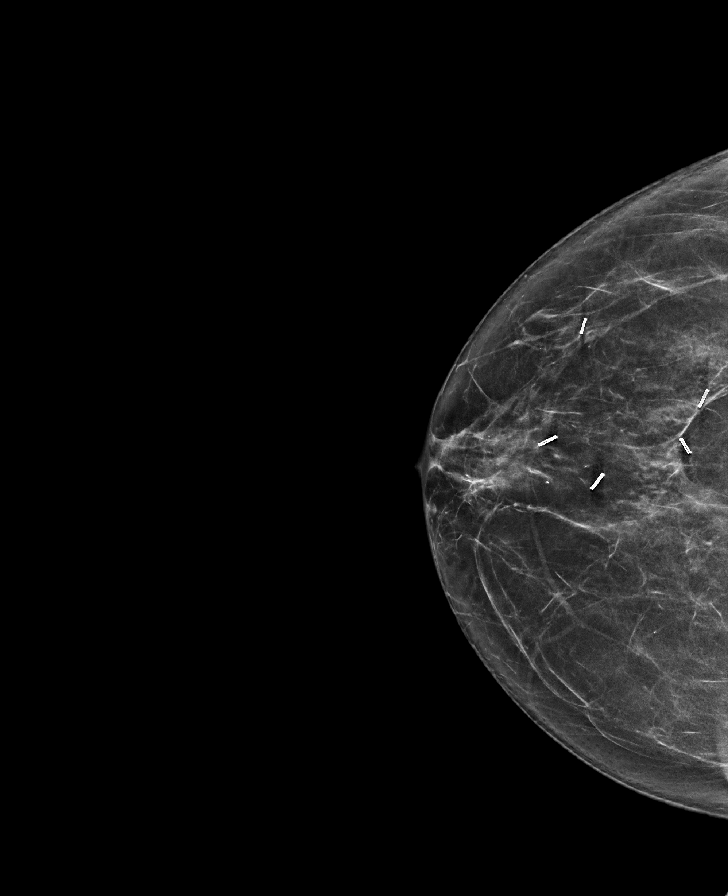

[R CC tomo · tomo slice 34/67.0]
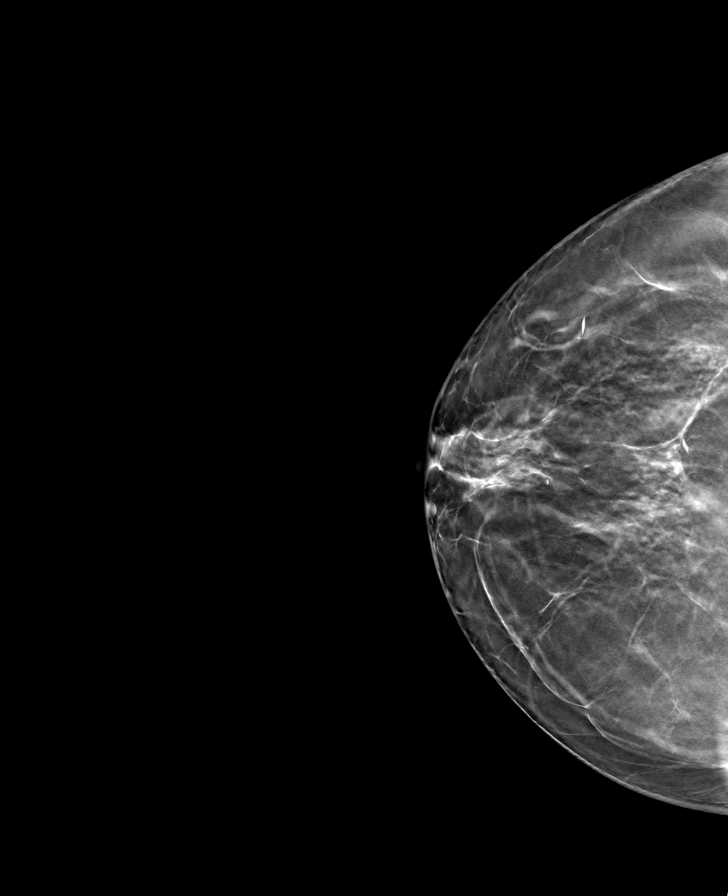

[L MLO tomo · tomo slice 37/74.0]
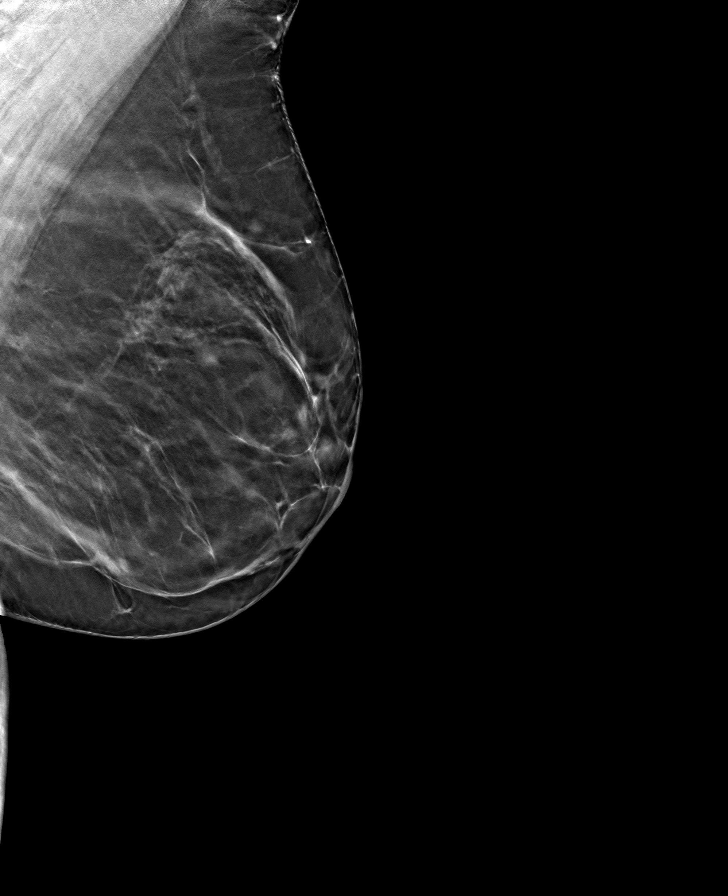

[L CC tomo · tomo slice 36/71.0]
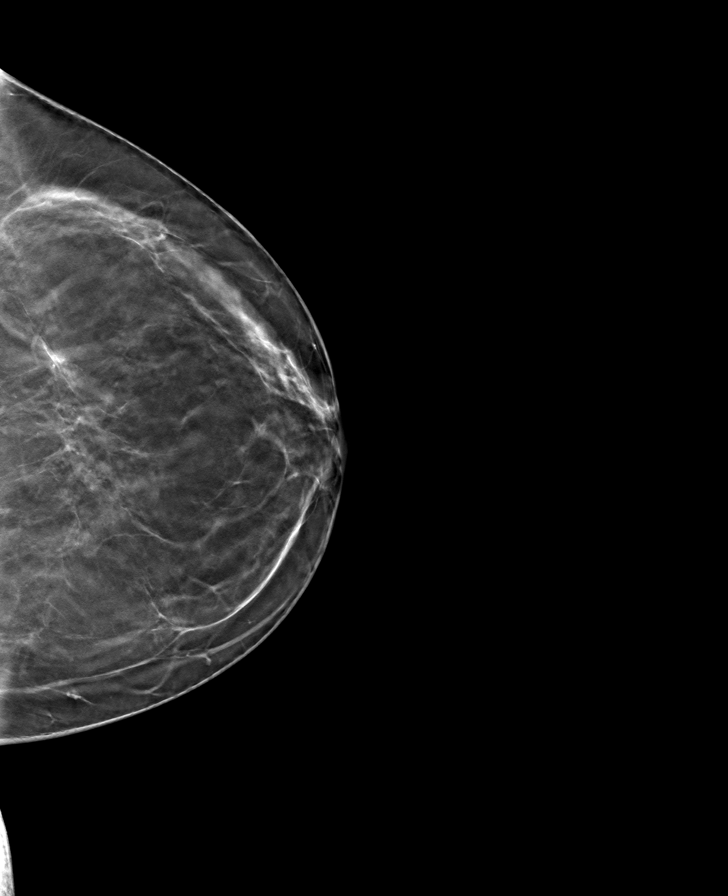

[R MLO tomo · tomo slice 37/73.0]
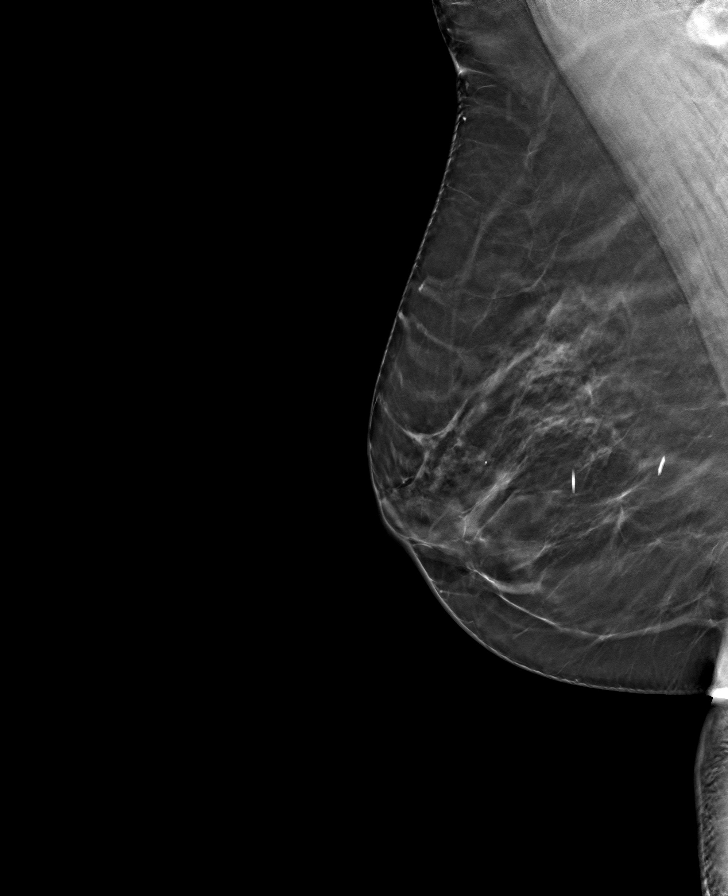

[8 of 24 positions shown; findings below may reference images not displayed]

ACR Breast Density Category b: There are scattered areas of
fibroglandular density.
FINDINGS: There are no findings suspicious for malignancy.
IMPRESSION: No mammographic evidence of malignancy. A result letter of this
screening mammogram will be mailed directly to the patient.

RECOMMENDATION:
Screening mammogram in one year. (Code:51-O-LD2)

BI-RADS CATEGORY  1: Negative.

## 2022-02-10 DIAGNOSIS — Z85850 Personal history of malignant neoplasm of thyroid: Secondary | ICD-10-CM | POA: Diagnosis not present

## 2022-02-10 DIAGNOSIS — E063 Autoimmune thyroiditis: Secondary | ICD-10-CM | POA: Diagnosis not present

## 2022-02-10 DIAGNOSIS — E038 Other specified hypothyroidism: Secondary | ICD-10-CM | POA: Diagnosis not present

## 2022-02-10 DIAGNOSIS — M8589 Other specified disorders of bone density and structure, multiple sites: Secondary | ICD-10-CM | POA: Diagnosis not present

## 2022-03-18 DIAGNOSIS — Z1211 Encounter for screening for malignant neoplasm of colon: Secondary | ICD-10-CM | POA: Diagnosis not present

## 2022-03-18 DIAGNOSIS — D123 Benign neoplasm of transverse colon: Secondary | ICD-10-CM | POA: Diagnosis not present

## 2022-03-18 DIAGNOSIS — Z8371 Family history of colonic polyps: Secondary | ICD-10-CM | POA: Diagnosis not present

## 2022-09-09 DIAGNOSIS — Z0000 Encounter for general adult medical examination without abnormal findings: Secondary | ICD-10-CM | POA: Diagnosis not present

## 2022-09-09 DIAGNOSIS — Z23 Encounter for immunization: Secondary | ICD-10-CM | POA: Diagnosis not present

## 2022-10-26 ENCOUNTER — Other Ambulatory Visit: Payer: Self-pay | Admitting: Family Medicine

## 2022-10-26 DIAGNOSIS — Z1231 Encounter for screening mammogram for malignant neoplasm of breast: Secondary | ICD-10-CM

## 2022-12-08 ENCOUNTER — Ambulatory Visit
Admission: RE | Admit: 2022-12-08 | Discharge: 2022-12-08 | Disposition: A | Discharge status: Home / Self Care | Payer: BC Managed Care – PPO | Source: Ambulatory Visit | Attending: Family Medicine | Admitting: Family Medicine

## 2022-12-08 DIAGNOSIS — Z1231 Encounter for screening mammogram for malignant neoplasm of breast: Secondary | ICD-10-CM | POA: Diagnosis not present

## 2023-02-22 DIAGNOSIS — E063 Autoimmune thyroiditis: Secondary | ICD-10-CM | POA: Diagnosis not present

## 2023-02-22 DIAGNOSIS — E038 Other specified hypothyroidism: Secondary | ICD-10-CM | POA: Diagnosis not present

## 2023-02-26 DIAGNOSIS — E038 Other specified hypothyroidism: Secondary | ICD-10-CM | POA: Diagnosis not present

## 2023-02-26 DIAGNOSIS — M8589 Other specified disorders of bone density and structure, multiple sites: Secondary | ICD-10-CM | POA: Diagnosis not present

## 2023-02-26 DIAGNOSIS — E063 Autoimmune thyroiditis: Secondary | ICD-10-CM | POA: Diagnosis not present

## 2023-08-04 DIAGNOSIS — H35413 Lattice degeneration of retina, bilateral: Secondary | ICD-10-CM | POA: Diagnosis not present

## 2023-09-20 DIAGNOSIS — L821 Other seborrheic keratosis: Secondary | ICD-10-CM | POA: Diagnosis not present

## 2023-09-20 DIAGNOSIS — D2239 Melanocytic nevi of other parts of face: Secondary | ICD-10-CM | POA: Diagnosis not present

## 2023-09-20 DIAGNOSIS — D485 Neoplasm of uncertain behavior of skin: Secondary | ICD-10-CM | POA: Diagnosis not present

## 2023-09-20 DIAGNOSIS — D2371 Other benign neoplasm of skin of right lower limb, including hip: Secondary | ICD-10-CM | POA: Diagnosis not present

## 2023-11-01 ENCOUNTER — Other Ambulatory Visit: Payer: Self-pay | Admitting: Family Medicine

## 2023-11-01 DIAGNOSIS — Z1231 Encounter for screening mammogram for malignant neoplasm of breast: Secondary | ICD-10-CM

## 2023-12-10 ENCOUNTER — Ambulatory Visit
Admission: RE | Admit: 2023-12-10 | Discharge: 2023-12-10 | Disposition: A | Discharge status: Home / Self Care | Source: Ambulatory Visit | Attending: Family Medicine | Admitting: Family Medicine

## 2023-12-10 DIAGNOSIS — Z1231 Encounter for screening mammogram for malignant neoplasm of breast: Secondary | ICD-10-CM | POA: Diagnosis not present

## 2024-02-29 DIAGNOSIS — E063 Autoimmune thyroiditis: Secondary | ICD-10-CM | POA: Diagnosis not present

## 2024-03-03 DIAGNOSIS — E063 Autoimmune thyroiditis: Secondary | ICD-10-CM | POA: Diagnosis not present

## 2024-03-03 DIAGNOSIS — E039 Hypothyroidism, unspecified: Secondary | ICD-10-CM | POA: Diagnosis not present

## 2024-05-01 DIAGNOSIS — E039 Hypothyroidism, unspecified: Secondary | ICD-10-CM | POA: Diagnosis not present
